# Patient Record
Sex: Female | Born: 1940 | Race: White | Hispanic: No | Marital: Single | Smoking: Never smoker
Health system: Southern US, Community
[De-identification: ages and names within clinical notes are randomized; demographics above are authoritative.]

## PROBLEM LIST (undated history)

## (undated) DIAGNOSIS — E785 Hyperlipidemia, unspecified: Secondary | ICD-10-CM

## (undated) DIAGNOSIS — S72009A Fracture of unspecified part of neck of unspecified femur, initial encounter for closed fracture: Secondary | ICD-10-CM

## (undated) DIAGNOSIS — M1610 Unilateral primary osteoarthritis, unspecified hip: Secondary | ICD-10-CM

## (undated) DIAGNOSIS — K219 Gastro-esophageal reflux disease without esophagitis: Secondary | ICD-10-CM

## (undated) DIAGNOSIS — M8580 Other specified disorders of bone density and structure, unspecified site: Secondary | ICD-10-CM

## (undated) DIAGNOSIS — K449 Diaphragmatic hernia without obstruction or gangrene: Secondary | ICD-10-CM

## (undated) DIAGNOSIS — M1990 Unspecified osteoarthritis, unspecified site: Secondary | ICD-10-CM

## (undated) DIAGNOSIS — R413 Other amnesia: Secondary | ICD-10-CM

## (undated) DIAGNOSIS — R251 Tremor, unspecified: Secondary | ICD-10-CM

## (undated) DIAGNOSIS — I10 Essential (primary) hypertension: Secondary | ICD-10-CM

## (undated) HISTORY — DX: Hyperlipidemia, unspecified: E78.5

## (undated) HISTORY — PX: SMALL INTESTINE SURGERY: SHX150

## (undated) HISTORY — DX: Other amnesia: R41.3

## (undated) HISTORY — DX: Essential (primary) hypertension: I10

## (undated) HISTORY — DX: Unilateral primary osteoarthritis, unspecified hip: M16.10

## (undated) HISTORY — DX: Gastro-esophageal reflux disease without esophagitis: K21.9

## (undated) HISTORY — DX: Other specified disorders of bone density and structure, unspecified site: M85.80

## (undated) HISTORY — DX: Fracture of unspecified part of neck of unspecified femur, initial encounter for closed fracture: S72.009A

## (undated) HISTORY — DX: Diaphragmatic hernia without obstruction or gangrene: K44.9

## (undated) HISTORY — DX: Tremor, unspecified: R25.1

## (undated) HISTORY — PX: ABDOMINAL HYSTERECTOMY: SHX81

## (undated) HISTORY — DX: Unspecified osteoarthritis, unspecified site: M19.90

---

## 2001-06-29 ENCOUNTER — Ambulatory Visit (HOSPITAL_COMMUNITY): Admission: RE | Admit: 2001-06-29 | Discharge: 2001-06-29 | Payer: Self-pay | Admitting: Gastroenterology

## 2001-07-09 ENCOUNTER — Encounter: Payer: Self-pay | Admitting: Gastroenterology

## 2001-07-09 ENCOUNTER — Ambulatory Visit (HOSPITAL_COMMUNITY): Admission: RE | Admit: 2001-07-09 | Discharge: 2001-07-09 | Payer: Self-pay | Admitting: Gastroenterology

## 2001-07-09 ENCOUNTER — Encounter (INDEPENDENT_AMBULATORY_CARE_PROVIDER_SITE_OTHER): Payer: Self-pay | Admitting: Specialist

## 2001-07-19 ENCOUNTER — Encounter: Payer: Self-pay | Admitting: Gastroenterology

## 2001-07-19 ENCOUNTER — Ambulatory Visit (HOSPITAL_COMMUNITY): Admission: RE | Admit: 2001-07-19 | Discharge: 2001-07-19 | Payer: Self-pay | Admitting: Gastroenterology

## 2009-03-08 ENCOUNTER — Emergency Department (HOSPITAL_COMMUNITY): Admission: EM | Admit: 2009-03-08 | Discharge: 2009-03-08 | Payer: Self-pay | Admitting: Emergency Medicine

## 2009-08-22 ENCOUNTER — Inpatient Hospital Stay (HOSPITAL_COMMUNITY): Admission: EM | Admit: 2009-08-22 | Discharge: 2009-08-31 | Payer: Self-pay | Admitting: Emergency Medicine

## 2010-07-16 LAB — GLUCOSE, CAPILLARY
Glucose-Capillary: 101 mg/dL — ABNORMAL HIGH (ref 70–99)
Glucose-Capillary: 104 mg/dL — ABNORMAL HIGH (ref 70–99)
Glucose-Capillary: 105 mg/dL — ABNORMAL HIGH (ref 70–99)
Glucose-Capillary: 110 mg/dL — ABNORMAL HIGH (ref 70–99)
Glucose-Capillary: 110 mg/dL — ABNORMAL HIGH (ref 70–99)
Glucose-Capillary: 112 mg/dL — ABNORMAL HIGH (ref 70–99)
Glucose-Capillary: 123 mg/dL — ABNORMAL HIGH (ref 70–99)
Glucose-Capillary: 124 mg/dL — ABNORMAL HIGH (ref 70–99)
Glucose-Capillary: 94 mg/dL (ref 70–99)
Glucose-Capillary: 95 mg/dL (ref 70–99)
Glucose-Capillary: 98 mg/dL (ref 70–99)
Glucose-Capillary: 98 mg/dL (ref 70–99)
Glucose-Capillary: 99 mg/dL (ref 70–99)
Glucose-Capillary: 109 mg/dL — ABNORMAL HIGH (ref 70–99)
Glucose-Capillary: 111 mg/dL — ABNORMAL HIGH (ref 70–99)
Glucose-Capillary: 112 mg/dL — ABNORMAL HIGH (ref 70–99)
Glucose-Capillary: 113 mg/dL — ABNORMAL HIGH (ref 70–99)
Glucose-Capillary: 122 mg/dL — ABNORMAL HIGH (ref 70–99)
Glucose-Capillary: 131 mg/dL — ABNORMAL HIGH (ref 70–99)
Glucose-Capillary: 133 mg/dL — ABNORMAL HIGH (ref 70–99)
Glucose-Capillary: 134 mg/dL — ABNORMAL HIGH (ref 70–99)
Glucose-Capillary: 136 mg/dL — ABNORMAL HIGH (ref 70–99)
Glucose-Capillary: 141 mg/dL — ABNORMAL HIGH (ref 70–99)
Glucose-Capillary: 145 mg/dL — ABNORMAL HIGH (ref 70–99)
Glucose-Capillary: 146 mg/dL — ABNORMAL HIGH (ref 70–99)
Glucose-Capillary: 153 mg/dL — ABNORMAL HIGH (ref 70–99)
Glucose-Capillary: 176 mg/dL — ABNORMAL HIGH (ref 70–99)
Glucose-Capillary: 99 mg/dL (ref 70–99)

## 2010-07-16 LAB — BASIC METABOLIC PANEL
BUN: 11 mg/dL (ref 6–23)
BUN: 11 mg/dL (ref 6–23)
CO2: 23 mEq/L (ref 19–32)
CO2: 23 mEq/L (ref 19–32)
CO2: 26 mEq/L (ref 19–32)
Calcium: 7.5 mg/dL — ABNORMAL LOW (ref 8.4–10.5)
Calcium: 7.8 mg/dL — ABNORMAL LOW (ref 8.4–10.5)
Chloride: 109 mEq/L (ref 96–112)
Chloride: 109 mEq/L (ref 96–112)
Creatinine, Ser: 0.62 mg/dL (ref 0.4–1.2)
Creatinine, Ser: 0.7 mg/dL (ref 0.4–1.2)
Creatinine, Ser: 0.7 mg/dL (ref 0.4–1.2)
Creatinine, Ser: 0.73 mg/dL (ref 0.4–1.2)
GFR calc Af Amer: >60 mL/min (ref >60)
GFR calc Af Amer: >60 mL/min (ref >60)
GFR calc non Af Amer: >60 mL/min (ref >60)
Glucose, Bld: 99 mg/dL (ref 70–99)

## 2010-07-16 LAB — DIFFERENTIAL
Eosinophils Relative: 1 % (ref 0–5)
Lymphocytes Relative: 18 % (ref 12–46)
Lymphs Abs: 1.4 10*3/uL (ref 0.7–4.0)
Neutro Abs: 6.2 10*3/uL (ref 1.7–7.7)

## 2010-07-16 LAB — URINALYSIS, ROUTINE W REFLEX MICROSCOPIC
Bilirubin Urine: NEGATIVE
Nitrite: NEGATIVE
Specific Gravity, Urine: 1.016 (ref 1.005–1.030)
Urobilinogen, UA: 0.2 mg/dL (ref 0.0–1.0)
pH: 7.5 (ref 5.0–8.0)

## 2010-07-16 LAB — COMPREHENSIVE METABOLIC PANEL
AST: 24 U/L (ref 0–37)
Albumin: 4.3 g/dL (ref 3.5–5.2)
BUN: 14 mg/dL (ref 6–23)
CO2: 30 mEq/L (ref 19–32)
Calcium: 9.7 mg/dL (ref 8.4–10.5)
Creatinine, Ser: 0.8 mg/dL (ref 0.4–1.2)
GFR calc Af Amer: >60 mL/min (ref >60)
GFR calc non Af Amer: >60 mL/min (ref >60)

## 2010-07-16 LAB — PHOSPHORUS: Phosphorus: 2.3 mg/dL (ref 2.3–4.6)

## 2010-07-16 LAB — CBC
MCHC: 34 g/dL (ref 30.0–36.0)
MCHC: 34.7 g/dL (ref 30.0–36.0)
MCV: 86.5 fL (ref 78.0–100.0)
Platelets: 134 10*3/uL — ABNORMAL LOW (ref 150–400)
Platelets: 161 10*3/uL (ref 150–400)
RBC: 3.74 MIL/uL — ABNORMAL LOW (ref 3.87–5.11)
RDW: 13.4 % (ref 11.5–15.5)
WBC: 7 10*3/uL (ref 4.0–10.5)

## 2010-07-16 LAB — TYPE AND SCREEN: ABO/RH(D): A POS

## 2010-07-16 LAB — MAGNESIUM: Magnesium: 2.1 mg/dL (ref 1.5–2.5)

## 2010-07-16 LAB — LIPASE, BLOOD: Lipase: 33 U/L (ref 11–59)

## 2011-04-29 DIAGNOSIS — I4510 Unspecified right bundle-branch block: Secondary | ICD-10-CM

## 2011-04-29 HISTORY — DX: Unspecified right bundle-branch block: I45.10

## 2012-04-08 ENCOUNTER — Other Ambulatory Visit: Payer: Self-pay | Admitting: Ophthalmology

## 2014-06-30 ENCOUNTER — Other Ambulatory Visit (HOSPITAL_COMMUNITY): Payer: Self-pay | Admitting: *Deleted

## 2014-07-03 ENCOUNTER — Encounter (HOSPITAL_COMMUNITY)
Admission: RE | Admit: 2014-07-03 | Discharge: 2014-07-03 | Disposition: A | Discharge status: Home / Self Care | Payer: Medicare Other | Source: Ambulatory Visit | Attending: Internal Medicine | Admitting: Internal Medicine

## 2014-07-03 DIAGNOSIS — M810 Age-related osteoporosis without current pathological fracture: Secondary | ICD-10-CM | POA: Insufficient documentation

## 2014-07-03 DIAGNOSIS — Z5181 Encounter for therapeutic drug level monitoring: Secondary | ICD-10-CM | POA: Diagnosis not present

## 2014-07-03 MED ORDER — DENOSUMAB 60 MG/ML ~~LOC~~ SOLN
60.0000 mg | Freq: Once | SUBCUTANEOUS | Status: AC
  Administered 2014-07-03: 60 mg via SUBCUTANEOUS
  Filled 2014-07-03: qty 1

## 2014-12-13 ENCOUNTER — Other Ambulatory Visit (HOSPITAL_COMMUNITY): Payer: Self-pay | Admitting: *Deleted

## 2014-12-14 ENCOUNTER — Ambulatory Visit (HOSPITAL_COMMUNITY)
Admission: RE | Admit: 2014-12-14 | Discharge: 2014-12-14 | Disposition: A | Discharge status: Home / Self Care | Payer: Medicare Other | Source: Ambulatory Visit | Attending: Internal Medicine | Admitting: Internal Medicine

## 2014-12-14 DIAGNOSIS — M810 Age-related osteoporosis without current pathological fracture: Secondary | ICD-10-CM | POA: Insufficient documentation

## 2014-12-14 MED ORDER — DENOSUMAB 60 MG/ML ~~LOC~~ SOLN
60.0000 mg | Freq: Once | SUBCUTANEOUS | Status: AC
  Administered 2014-12-14: 60 mg via SUBCUTANEOUS
  Filled 2014-12-14: qty 1

## 2015-06-28 ENCOUNTER — Other Ambulatory Visit (HOSPITAL_COMMUNITY): Payer: Self-pay | Admitting: *Deleted

## 2015-06-29 ENCOUNTER — Encounter (HOSPITAL_COMMUNITY)
Admission: RE | Admit: 2015-06-29 | Discharge: 2015-06-29 | Disposition: A | Discharge status: Home / Self Care | Payer: Medicare Other | Source: Ambulatory Visit | Attending: Internal Medicine | Admitting: Internal Medicine

## 2015-06-29 DIAGNOSIS — M810 Age-related osteoporosis without current pathological fracture: Secondary | ICD-10-CM | POA: Insufficient documentation

## 2015-06-29 MED ORDER — DENOSUMAB 60 MG/ML ~~LOC~~ SOLN
60.0000 mg | Freq: Once | SUBCUTANEOUS | Status: AC
  Administered 2015-06-29: 60 mg via SUBCUTANEOUS
  Filled 2015-06-29: qty 1

## 2016-02-20 ENCOUNTER — Other Ambulatory Visit (HOSPITAL_COMMUNITY): Payer: Self-pay | Admitting: *Deleted

## 2016-02-21 ENCOUNTER — Ambulatory Visit (HOSPITAL_COMMUNITY)
Admission: RE | Admit: 2016-02-21 | Discharge: 2016-02-21 | Disposition: A | Discharge status: Home / Self Care | Payer: Medicare Other | Source: Ambulatory Visit | Attending: Internal Medicine | Admitting: Internal Medicine

## 2016-02-21 DIAGNOSIS — M810 Age-related osteoporosis without current pathological fracture: Secondary | ICD-10-CM | POA: Insufficient documentation

## 2016-02-21 MED ORDER — DENOSUMAB 60 MG/ML ~~LOC~~ SOLN
60.0000 mg | Freq: Once | SUBCUTANEOUS | Status: AC
  Administered 2016-02-21: 60 mg via SUBCUTANEOUS
  Filled 2016-02-21: qty 1

## 2016-08-22 ENCOUNTER — Other Ambulatory Visit (HOSPITAL_COMMUNITY): Payer: Self-pay | Admitting: *Deleted

## 2016-08-25 ENCOUNTER — Encounter (HOSPITAL_COMMUNITY): Payer: Medicare Other

## 2016-09-01 ENCOUNTER — Other Ambulatory Visit (HOSPITAL_COMMUNITY): Payer: Self-pay | Admitting: *Deleted

## 2016-09-02 ENCOUNTER — Inpatient Hospital Stay (HOSPITAL_COMMUNITY): Admission: RE | Admit: 2016-09-02 | Payer: Medicare Other | Source: Ambulatory Visit

## 2016-09-10 ENCOUNTER — Ambulatory Visit (HOSPITAL_COMMUNITY)
Admission: RE | Admit: 2016-09-10 | Discharge: 2016-09-10 | Disposition: A | Discharge status: Home / Self Care | Payer: Medicare Other | Source: Ambulatory Visit | Attending: Internal Medicine | Admitting: Internal Medicine

## 2016-09-10 DIAGNOSIS — M810 Age-related osteoporosis without current pathological fracture: Secondary | ICD-10-CM | POA: Diagnosis not present

## 2016-09-10 MED ORDER — DENOSUMAB 60 MG/ML ~~LOC~~ SOLN
60.0000 mg | Freq: Once | SUBCUTANEOUS | Status: AC
  Administered 2016-09-10: 60 mg via SUBCUTANEOUS
  Filled 2016-09-10: qty 1

## 2017-01-15 ENCOUNTER — Other Ambulatory Visit (HOSPITAL_COMMUNITY): Payer: Self-pay | Admitting: *Deleted

## 2017-01-16 ENCOUNTER — Ambulatory Visit (HOSPITAL_COMMUNITY)
Admission: RE | Admit: 2017-01-16 | Discharge: 2017-01-16 | Disposition: A | Discharge status: Home / Self Care | Payer: Medicare Other | Source: Ambulatory Visit | Attending: Internal Medicine | Admitting: Internal Medicine

## 2017-01-16 DIAGNOSIS — M810 Age-related osteoporosis without current pathological fracture: Secondary | ICD-10-CM | POA: Diagnosis present

## 2017-01-16 MED ORDER — DENOSUMAB 60 MG/ML ~~LOC~~ SOLN
60.0000 mg | Freq: Once | SUBCUTANEOUS | Status: AC
  Administered 2017-01-16: 60 mg via SUBCUTANEOUS
  Filled 2017-01-16: qty 1

## 2017-09-08 ENCOUNTER — Other Ambulatory Visit (HOSPITAL_COMMUNITY): Payer: Self-pay | Admitting: *Deleted

## 2017-09-09 ENCOUNTER — Ambulatory Visit (HOSPITAL_COMMUNITY)
Admission: RE | Admit: 2017-09-09 | Discharge: 2017-09-09 | Disposition: A | Discharge status: Home / Self Care | Payer: Medicare Other | Source: Ambulatory Visit | Attending: Internal Medicine | Admitting: Internal Medicine

## 2017-09-09 DIAGNOSIS — M810 Age-related osteoporosis without current pathological fracture: Secondary | ICD-10-CM | POA: Insufficient documentation

## 2017-09-09 MED ORDER — DENOSUMAB 60 MG/ML ~~LOC~~ SOSY
60.0000 mg | PREFILLED_SYRINGE | Freq: Once | SUBCUTANEOUS | Status: AC
  Administered 2017-09-09: 60 mg via SUBCUTANEOUS
  Filled 2017-09-09: qty 1

## 2018-08-05 ENCOUNTER — Other Ambulatory Visit: Payer: Self-pay

## 2018-08-05 ENCOUNTER — Ambulatory Visit: Payer: Medicare Other | Admitting: Podiatry

## 2018-08-05 DIAGNOSIS — L601 Onycholysis: Secondary | ICD-10-CM

## 2018-08-05 DIAGNOSIS — B351 Tinea unguium: Secondary | ICD-10-CM | POA: Diagnosis not present

## 2018-08-05 NOTE — Patient Instructions (Signed)

## 2018-08-05 NOTE — Progress Notes (Signed)
Subjective:  Patient ID: Tracey Keith, female    DOB: [birth year],  MRN: [id number]  Chief Complaint  Patient presents with  . Ingrown Toenail    Reports pain to the R 2nd toenail for 3 weeks duration. Pain worst with hoes, sharp in nature, unable to wear regular shoes due to the pain.    78 y.o. female presents with the above complaint.   Review of Systems: Negative except as noted in the HPI. Denies N/V/F/Ch.  No past medical history on file.  Current Outpatient Medications:  .  diphenhydrAMINE (BENADRYL ALLERGY) 25 MG tablet, Take by mouth., Disp: , Rfl:  .  mometasone (ELOCON) 0.1 % ointment, Apply sparingly once or twice daily, Disp: , Rfl:  .  acetaminophen (TYLENOL) 500 MG tablet, Take by mouth., Disp: , Rfl:  .  aspirin 81 MG chewable tablet, Chew by mouth., Disp: , Rfl:  .  atorvastatin (LIPITOR) 80 MG tablet, Take by mouth., Disp: , Rfl:  .  BIOTIN 5000 PO, Take by mouth., Disp: , Rfl:  .  CALCIUM-VITAMIN D PO, Take by mouth., Disp: , Rfl:  .  fluticasone (FLONASE) 50 MCG/ACT nasal spray, fluticasone propionate 50 mcg/actuation nasal spray,suspension  USE 2 SPRAYS EACH NOSTRIL DAILY FOR 7 DAYS, THEN 1 SPRAY DAILY FOR 7 DAYS, Disp: , Rfl:  .  hydrochlorothiazide (HYDRODIURIL) 25 MG tablet, , Disp: , Rfl:  .  LUMIGAN 0.01 % SOLN, Place 1 drop into both eyes at bedtime., Disp: , Rfl:  .  meloxicam (MOBIC) 7.5 MG tablet, , Disp: , Rfl:  .  METFORMIN HCL PO, Take by mouth., Disp: , Rfl:  .  Multiple Vitamin (THERA) TABS, Take by mouth., Disp: , Rfl:  .  Omega-3 Fatty Acids (FISH OIL PO), Take by mouth., Disp: , Rfl:  .  omeprazole (PRILOSEC) 40 MG capsule, Take by mouth., Disp: , Rfl:  .  ONE TOUCH ULTRA TEST test strip, USE TO TEST BLOOD SUGARS ONCE DAILY, Disp: , Rfl:  .  sulfamethoxazole-trimethoprim (BACTRIM DS,SEPTRA DS) 800-160 MG tablet, sulfamethoxazole 800 mg-trimethoprim 160 mg tablet  1 TAB TWICE DAILY FOR 7 DAYS, Disp: , Rfl:  .  timolol (TIMOPTIC) 0.5 % ophthalmic  solution, , Disp: , Rfl:   Social History   Tobacco Use  Smoking Status Not on file    Allergies  Allergen Reactions  . Cephalexin Itching  . Codeine Nausea Only   Objective:  There were no vitals filed for this visit. There is no height or weight on file to calculate BMI. Constitutional Well developed. Well nourished.  Vascular Dorsalis pedis pulses palpable bilaterally. Posterior tibial pulses palpable bilaterally. Capillary refill normal to all digits.  No cyanosis or clubbing noted. Pedal hair growth normal.  Neurologic Normal speech. Oriented to person, place, and time. Epicritic sensation to light touch grossly present bilaterally.  Dermatologic Nails right 2nd toenail with onyhcolysis/onyhcomadesis No open wounds. No skin lesions.  Orthopedic: Normal joint ROM without pain or crepitus bilaterally. No visible deformities. No bony tenderness.   Radiographs: None Assessment:   1. Onychomycosis   2. Onycholysis    Plan:  Patient was evaluated and treated and all questions answered.  R 2nd Toenail Lysis -Nail removed as below. -Educated on post-op care  Procedure: Avulsion of toenail Location: Right 2nd toenail  Anesthesia: Lidocaine 1% plain; 1.5 mL and Marcaine 0.5% plain; 1.5 mL, digital block. Skin Prep: Betadine. Dressing: Silvadene; telfa; dry, sterile, compression dressing. Technique: Following skin prep, the toe was exsanguinated and a  tourniquet was secured at the base of the toe. The nail was freed and avulsed with a hemostat. The area was cleansed. The tourniquet was then removed and sterile dressing applied. Disposition: Patient tolerated procedure well.     Return in about 2 weeks (around 08/19/2018) for Nail Check.

## 2019-05-04 ENCOUNTER — Ambulatory Visit: Payer: Medicare Other | Admitting: Diagnostic Neuroimaging

## 2019-06-23 ENCOUNTER — Encounter: Payer: Self-pay | Admitting: Neurology

## 2019-06-23 ENCOUNTER — Ambulatory Visit: Payer: Medicare Other | Admitting: Neurology

## 2019-06-23 ENCOUNTER — Telehealth: Payer: Self-pay | Admitting: Neurology

## 2019-06-23 ENCOUNTER — Other Ambulatory Visit: Payer: Self-pay

## 2019-06-23 VITALS — BP 160/75 | HR 75 | Temp 96.9°F | Ht 67.0 in | Wt 114.0 lb

## 2019-06-23 DIAGNOSIS — F0280 Dementia in other diseases classified elsewhere without behavioral disturbance: Secondary | ICD-10-CM

## 2019-06-23 DIAGNOSIS — G301 Alzheimer's disease with late onset: Secondary | ICD-10-CM | POA: Diagnosis not present

## 2019-06-23 DIAGNOSIS — R413 Other amnesia: Secondary | ICD-10-CM

## 2019-06-23 DIAGNOSIS — R269 Unspecified abnormalities of gait and mobility: Secondary | ICD-10-CM | POA: Diagnosis not present

## 2019-06-23 DIAGNOSIS — G250 Essential tremor: Secondary | ICD-10-CM

## 2019-06-23 MED ORDER — DONEPEZIL HCL 5 MG PO TABS: 5.0000 mg | ORAL_TABLET | Freq: Every day | ORAL | 5 refills | Status: DC

## 2019-06-23 NOTE — Telephone Encounter (Signed)
[employer] medicare order sent to [facility]. No auth they will reach out to the patient to schedule.  

## 2019-06-23 NOTE — Progress Notes (Signed)
[facility]  PATIENT: Tracey Keith DOB: 02-10-1941  REFERRING DOCTOR OR PCP: Guerry Bruin MD SOURCE: Patient, notes from primary care, lab reports.  _________________________________   HISTORICAL  CHIEF COMPLAINT:  Chief Complaint  Patient presents with  . Memory Loss    rm 12, New Pt,  Pat - niece  HC-POA    . Tremors    HISTORY OF PRESENT ILLNESS:  I had the pleasure of seeing your patient, Tracey Keith, at [facility] for neurologic consultation regarding her memory loss and tremors  She is an 79 year old woman who was noted by her family to have progressive memory loss over the past 2 years.   Her niece was first concerned 14 months ago when she got lost driving to a relative's house.    She also reports that her aunt has gotten lost while driving.    She does not always remember to use the GPS.   She was 'missing' for a few days when no one was able to get in touch with her.   She has forgotten to take medications and has not been regularly checking blood sugar.     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  She is driving daily including [employer] (back and forth between home and work and to the stores) and denies any difficulties.    She reports she does not have to balance any books.  She denies that she has had any problems doing paperwork for her job.    She has had some tremors I her hands.  She has had a few falls.   She does not remember any details of the falls  Montreal Cognitive Assessment  06/23/2019  Visuospatial/ Executive (0/5) 1  Naming (0/3) 3  Attention: Read list of digits (0/2) 1  Attention: Read list of letters (0/1) 1  Attention: Serial 7 subtraction starting at 100 (0/3) 0  Language: Repeat phrase (0/2) 0  Language : Fluency (0/1) 0  Abstraction (0/2) 1  Delayed Recall (0/5) 0  Orientation (0/6) 2  Total 9     REVIEW OF  SYSTEMS: Constitutional: No fevers, chills, sweats, or change in appetite Eyes: No visual changes, double vision, eye pain Ear, nose and throat: No hearing loss, ear pain, nasal congestion, sore throat Cardiovascular: No chest pain, palpitations Respiratory: No shortness of breath at rest or with exertion.   No wheezes GastrointestinaI: No nausea, vomiting, diarrhea, abdominal pain, fecal incontinence Genitourinary: No dysuria, urinary retention or frequency.  No nocturia. Musculoskeletal: No neck pain, back pain Integumentary: No rash, pruritus, skin lesions Neurological: as above Psychiatric: No depression at this time.  No anxiety Endocrine: No palpitations, diaphoresis, change in appetite, change in weigh or increased thirst Hematologic/Lymphatic: No anemia, purpura, petechiae. Allergic/Immunologic: No itchy/runny eyes, nasal congestion, recent allergic reactions, rashes  ALLERGIES: Allergies  Allergen Reactions  . Cephalexin Itching  . Codeine Nausea Only    HOME MEDICATIONS:  Current Outpatient Medications:  .  acetaminophen (TYLENOL) 500 MG tablet, Take by mouth., Disp: , Rfl:  .  aspirin 81 MG chewable tablet, Chew by mouth., Disp: , Rfl:  .  atorvastatin (LIPITOR) 80 MG tablet, Take by mouth., Disp: , Rfl:  .  CALCIUM-VITAMIN D PO, Take by mouth., Disp: , Rfl:  .  hydrochlorothiazide (HYDRODIURIL) 25 MG tablet, , Disp: , Rfl:  .  LUMIGAN 0.01 % SOLN, Place 1 drop into  both eyes at bedtime., Disp: , Rfl:  .  METFORMIN HCL PO, Take by mouth., Disp: , Rfl:  .  Multiple Vitamin (THERA) TABS, Take by mouth., Disp: , Rfl:  .  Omega-3 Fatty Acids (FISH OIL PO), Take by mouth., Disp: , Rfl:  .  omeprazole (PRILOSEC) 40 MG capsule, Take by mouth., Disp: , Rfl:  .  BIOTIN 5000 PO, Take by mouth., Disp: , Rfl:  .  meloxicam (MOBIC) 7.5 MG tablet, , Disp: , Rfl:  .  mometasone (ELOCON) 0.1 % ointment, Apply sparingly once or twice daily, Disp: , Rfl:  .  ONE TOUCH ULTRA TEST  test strip, USE TO TEST BLOOD SUGARS ONCE DAILY, Disp: , Rfl:  .  timolol (TIMOPTIC) 0.5 % ophthalmic solution, , Disp: , Rfl:   PAST MEDICAL HISTORY: Past Medical History:  Diagnosis Date  . 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 GERD (gastroesophageal reflux disease)    w/cough  . Hiatal hernia   . Hip fracture ([place]) 1980's   left  . Hyperlipidemia   . Hypertension   . Memory loss   . Osteoarthritis    hands, finger  . Osteopenia   . RBBB 2013  . Tremor     PAST SURGICAL HISTORY:   FAMILY HISTORY: Family History  Problem Relation Age of Onset  . Tremor Maternal Aunt     SOCIAL HISTORY:  Social History   Socioeconomic History  . Marital status: Single    Spouse name: Not on file  . Number of children: 1  . Years of education: Not on file  . Highest education level: High school graduate  Occupational History    Comment: retired, [place]: 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  Tobacco Use  . Smoking status: Never Smoker  . Smokeless tobacco: Never Used  Substance and Sexual Activity  . Alcohol use: Never  . Drug use: Never  . Sexual activity: Not on file  Other Topics Concern  . 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 Difficulty of Paying Living Expenses: Not on file  Food Insecurity:   . Worried About Charity fundraiser in the Last Year: Not on file  . Ran Out of Food in the Last Year: Not on file  Transportation Needs:   . Lack of Transportation (Medical): Not on file  . Lack of Transportation (Non-Medical): Not on file  Physical Activity:   . Days of Exercise per Week: Not on file  . Minutes of Exercise per Session: Not on file  Stress:   . Feeling of Stress : Not on file  Social Connections:   . Frequency of Communication with Friends and Family: Not on  file  . Frequency of Social Gatherings with Friends and Family: Not on file  . Attends Religious Services: Not on file  . Active Member of Clubs or Organizations: Not on file  . Attends Archivist Meetings: Not on file  . Marital Status: Not on file  Intimate Partner Violence:   . Fear of Current or Ex-Partner: Not on file  . Emotionally Abused: Not on file  . Physically Abused: Not on file  . Sexually Abused: Not on file     PHYSICAL EXAM  Vitals:   06/23/19 1006  BP: (!) 160/75  Pulse: 75  Temp: (!) 96.9 F (36.1 C)  Weight: 114 lb (51.7 kg)  Height: 5\' 7"  (1.702 m)    Body mass index is 17.85 kg/m.   General: The patient is well-developed and well-nourished and in no acute distress  HEENT:  Head is [place]/AT.  Sclera are anicteric.  Funduscopic exam shows normal optic discs and retinal vessels.  Neck: No carotid bruits are noted.  The neck is nontender.  Cardiovascular: The heart has a regular rate and rhythm with a normal S1 and S2. There were no murmurs, gallops or rubs.    Skin: Extremities are without rash or  edema.  Musculoskeletal:  Back is nontender  Neurologic Exam  Mental status: The patient is alert and oriented x1-1/2.  Short-term memory is 0/5 and calculations are poor.  Attention is poor.   Speech is normal.  Cranial nerves: Extraocular movements are full. Pupils are equal, round, and reactive to light and accomodation.  Visual fields are full.  Facial symmetry is present. There is good facial sensation to soft touch bilaterally.Facial strength is normal.  Trapezius and sternocleidomastoid strength is normal. No dysarthria is noted.  The tongue is midline, and the patient has symmetric elevation of the soft palate. No obvious hearing deficits are noted.  Motor:  Muscle bulk is normal.   Tone is normal. Strength is  5 / 5 in all 4 extremities.   Sensory: Sensory testing is intact to pinprick, soft touch and vibration sensation in all 4  extremities.  Coordination: Cerebellar testing reveals good finger-nose-finger and heel-to-shin bilaterally.  Gait and station: Station is normal.   Gait is slightly wide and arthritic. Tandem gait is mildly wide but probably normal for age. Romberg is negative.   Reflexes: Deep tendon reflexes are symmetric and normal bilaterally.   Plantar responses are flexor.      ASSESSMENT AND PLAN  Late onset Alzheimer's disease without behavioral disturbance (HCC) - Plan: MR BRAIN WO CONTRAST  Memory loss - Plan: MR BRAIN WO CONTRAST, Thyroid Panel With TSH, Vitamin B12  Gait disturbance - Plan: Thyroid Panel With TSH, Vitamin B12  Essential tremor - Plan: Thyroid Panel With TSH   In summary, Ms. Goto is an 79 year old woman with progressive memory decline over the past couple of years.  She scored only 9/30 on the [facility] cognitive assessment today which would place her in a moderate to severe dementia.  However, on the interaction she appeared to be more mild to moderate.  Most likely diagnosis is Alzheimer's disease.  I will have her start donepezil.  Additionally we will check an MRI of the brain to determine the extent and pattern of atrophy and also rule out alternative diagnoses.  I will also check a thyroid panel and vitamin B12.  Amazingly, she continues to work running a [employer].  I am concerned about her ability to drive as she has gotten lost several times.  I recommended to her and her niece that she stop driving.  She derives a lot of pressure from her job and I think she could continue to do that but I recommend that somebody else drive her to work.  She will return to see Diplomatic Services operational officer in 3 months or sooner for new or worsening neurologic symptoms.  The dose of donepezil can be increased.  Depending on her response, memantine could also be considered.  Thank you for asking me to see Ms. Hinchcliff.  Please let me know if I can be of further assistance with her or other patients in the  future.  Tracey Perritt A. Epimenio Foot, MD, PhD, [place] 06/23/2019, 10:42 AM Certified in Neurology, Clinical Neurophysiology, Sleep Medicine and Neuroimaging  [facility] [address] [place], [place] [address] [phone]

## 2019-06-24 LAB — THYROID PANEL WITH TSH
Free Thyroxine Index: 2 (ref 1.2–4.9)
T3 Uptake Ratio: 29 % (ref 24–39)
T4, Total: 7 ug/dL (ref 4.5–12.0)
TSH: 1.08 u[IU]/mL (ref 0.450–4.500)

## 2019-06-24 LAB — VITAMIN B12: Vitamin B-12: 392 pg/mL (ref 232–1245)

## 2019-06-27 ENCOUNTER — Telehealth: Payer: Self-pay | Admitting: *Deleted

## 2019-06-27 NOTE — Telephone Encounter (Signed)
I called and spoke with pt about results. She verbalized understanding. Due to memory issues, I called niece, Dennie [facility]) and relayed results to her as well. She is aware order for MRI sent to [facility]. They will be called to get scheduled. She verbalized understanding.

## 2019-06-27 NOTE — Telephone Encounter (Signed)
-----   Message from Asa Lente, MD sent at 06/24/2019 11:32 AM EST ----- Please let the patient know that the lab work is fine.   We will let her know the results of the brain MRi a few days after it is done.

## 2019-06-27 NOTE — Telephone Encounter (Signed)
-----   Message from Richard A Sater, MD sent at 06/24/2019 11:32 AM EST ----- Please let the patient know that the lab work is fine.   We will let her know the results of the brain MRi a few days after it is done.    

## 2019-07-22 ENCOUNTER — Other Ambulatory Visit: Payer: Self-pay

## 2019-07-22 ENCOUNTER — Ambulatory Visit
Admission: RE | Admit: 2019-07-22 | Discharge: 2019-07-22 | Disposition: A | Discharge status: Home / Self Care | Payer: Medicare Other | Source: Ambulatory Visit | Attending: Neurology | Admitting: Neurology

## 2019-07-22 DIAGNOSIS — F0280 Dementia in other diseases classified elsewhere without behavioral disturbance: Secondary | ICD-10-CM

## 2019-07-22 DIAGNOSIS — R413 Other amnesia: Secondary | ICD-10-CM | POA: Diagnosis not present

## 2019-07-22 DIAGNOSIS — G301 Alzheimer's disease with late onset: Secondary | ICD-10-CM | POA: Diagnosis not present

## 2019-07-25 ENCOUNTER — Telehealth: Payer: Self-pay | Admitting: *Deleted

## 2019-07-25 NOTE — Telephone Encounter (Signed)
-----   Message from Asa Lente, MD sent at 07/22/2019  6:02 PM EDT ----- Let her and her family know that the MRI of the brain showed atrophy in a pattern consistent with Alzheimer's disease.  If she is tolerating donepezil, the dose can be increased from 5 mg to 10 mg daily.

## 2019-07-25 NOTE — Telephone Encounter (Signed)
Called and spoke with Tracey Keith (on [facility]-POA). Relayed Dr. Bonnita Hollow message about results. She verbalized understanding. She is requesting to schedule another visit with Dr. Epimenio Foot. She has continued to drive and Tracey Keith has not been able to take away car keys because she was diagnosed with covid-19 same week she saw Dr. Epimenio Foot and had to quarantine. She heard from gentleman that rents room from her mother that she stopped doing laundry/cleaing. Parks the car crooked. She spoke with her this past weekend about no longer driving but pt wants to hear it directly from Dr. Epimenio Foot. She does not remember the first visit she had with him. Advised I will discuss with MD and call her back.

## 2019-07-25 NOTE — Telephone Encounter (Signed)
Called Pat back. Per Dr. Epimenio Foot, ok to work pt in. Scheduled appt for 07/27/19 at 2:30pm. Asked they check in by 2:00pm, wear mask. She verbalized understanding.

## 2019-07-27 ENCOUNTER — Ambulatory Visit: Payer: Medicare Other | Admitting: Neurology

## 2019-07-27 ENCOUNTER — Encounter: Payer: Self-pay | Admitting: Neurology

## 2019-07-27 ENCOUNTER — Other Ambulatory Visit: Payer: Self-pay

## 2019-07-27 VITALS — BP 183/72 | HR 78 | Temp 97.6°F | Ht 67.0 in | Wt 116.5 lb

## 2019-07-27 DIAGNOSIS — Z789 Other specified health status: Secondary | ICD-10-CM | POA: Insufficient documentation

## 2019-07-27 DIAGNOSIS — F0280 Dementia in other diseases classified elsewhere without behavioral disturbance: Secondary | ICD-10-CM | POA: Diagnosis not present

## 2019-07-27 DIAGNOSIS — G301 Alzheimer's disease with late onset: Secondary | ICD-10-CM | POA: Diagnosis not present

## 2019-07-27 MED ORDER — DONEPEZIL HCL 5 MG PO TABS: 5.0000 mg | ORAL_TABLET | Freq: Every day | ORAL | 5 refills | Status: DC

## 2019-07-27 NOTE — Progress Notes (Signed)
[facility]  PATIENT: Tracey Keith DOB: [birth year]  REFERRING DOCTOR OR PCP: Guerry Bruin MD SOURCE: Patient, notes from primary care, lab reports.  _________________________________   HISTORICAL  CHIEF COMPLAINT:  Chief Complaint  Patient presents with  . Follow-up    RM 12 with niece, Pat (temp: 98.1). Here to discuss worsening memory, driving.     HISTORY OF PRESENT ILLNESS:  [name] ia a 79 y.o. 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  Update 07/27/2019: Since the last visit, she was prescribed donepezil and has had an imaging study.  She has not been taking any recently.    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   She drives herself and has been lost a few times.   We again discussed that she should not drive.     MRI of the brain was personally reviewed.  It shows generalized cortical atrophy that is moderately severe in the medial temporal lobes and milder elsewhere.  The atrophy has occurred since a 2010 CT scan.  She also has some scattered T2/FLAIR hyperintense foci in the hemispheres consistent with minimal chronic microvascular ischemic change, typical for age.   From initial consultation 06/23/2019: She is an 79 year old woman who was noted by her family to have progressive memory loss over the past 2 years.   Her niece was first concerned 14 months ago when she got lost driving to a relative's house.    She also reports that her aunt has gotten lost while driving.    She does not always remember to use the GPS.   She was 'missing' for a few days when no one was able to get in touch with her.   She has forgotten to take medications and has not been regularly checking blood sugar.     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  She is driving daily including [address] (back and forth between home and work and to the stores)  and denies any difficulties.    She reports she does not have to balance any books.  She denies that she has had any problems doing paperwork for her job.    She has had some tremors I her hands.  She has had a few falls.   She does not remember any details of the falls  Montreal Cognitive Assessment  06/23/2019  Visuospatial/ Executive (0/5) 1  Naming (0/3) 3  Attention: Read list of digits (0/2) 1  Attention: Read list of letters (0/1) 1  Attention: Serial 7 subtraction starting at 100 (0/3) 0  Language: Repeat phrase (0/2) 0  Language : Fluency (0/1) 0  Abstraction (0/2) 1  Delayed Recall (0/5) 0  Orientation (0/6) 2  Total 9     REVIEW OF SYSTEMS: Constitutional: No fevers, chills, sweats, or change in appetite Eyes: No visual changes, double vision, eye pain Ear, nose and throat: No hearing loss, ear pain, nasal congestion, sore throat Cardiovascular: No chest pain, palpitations Respiratory: No shortness of breath at rest or with exertion.   No wheezes GastrointestinaI: No nausea, vomiting, diarrhea, abdominal pain, fecal incontinence Genitourinary: No dysuria, urinary retention or frequency.  No nocturia. Musculoskeletal: No neck pain, back pain Integumentary: No rash, pruritus, skin lesions Neurological: as above Psychiatric: No depression at this time.  No anxiety Endocrine: No palpitations, diaphoresis, change in appetite, change in weigh  or increased thirst Hematologic/Lymphatic: No anemia, purpura, petechiae. Allergic/Immunologic: No itchy/runny eyes, nasal congestion, recent allergic reactions, rashes  ALLERGIES: Allergies  Allergen Reactions  . Cephalexin Itching  . Codeine Nausea Only    HOME MEDICATIONS:  Current Outpatient Medications:  .  acetaminophen (TYLENOL) 500 MG tablet, Take by mouth., Disp: , Rfl:  .  aspirin 81 MG chewable tablet, Chew by mouth., Disp: , Rfl:  .  atorvastatin (LIPITOR) 80 MG tablet, Take by mouth., Disp: , Rfl:  .  BIOTIN  5000 PO, Take by mouth., Disp: , Rfl:  .  CALCIUM-VITAMIN D PO, Take by mouth., Disp: , Rfl:  .  donepezil (ARICEPT) 5 MG tablet, Take 1 tablet (5 mg total) by mouth at bedtime., Disp: 30 tablet, Rfl: 5 .  hydrochlorothiazide (HYDRODIURIL) 25 MG tablet, , Disp: , Rfl:  .  LUMIGAN 0.01 % SOLN, Place 1 drop into both eyes at bedtime., Disp: , Rfl:  .  meloxicam (MOBIC) 7.5 MG tablet, , Disp: , Rfl:  .  METFORMIN HCL PO, Take by mouth., Disp: , Rfl:  .  mometasone (ELOCON) 0.1 % ointment, Apply sparingly once or twice daily, Disp: , Rfl:  .  Multiple Vitamin (THERA) TABS, Take by mouth., Disp: , Rfl:  .  Omega-3 Fatty Acids (FISH OIL PO), Take by mouth., Disp: , Rfl:  .  omeprazole (PRILOSEC) 40 MG capsule, Take by mouth., Disp: , Rfl:  .  ONE TOUCH ULTRA TEST test strip, USE TO TEST BLOOD SUGARS ONCE DAILY, Disp: , Rfl:  .  timolol (TIMOPTIC) 0.5 % ophthalmic solution, , Disp: , Rfl:   PAST MEDICAL HISTORY: Past Medical History:  Diagnosis Date  . 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 GERD (gastroesophageal reflux disease)    w/cough  . Hiatal hernia   . Hip fracture ([place]) 1980's   left  . Hyperlipidemia   . Hypertension   . Memory loss   . Osteoarthritis    hands, finger  . Osteopenia   . RBBB 2013  . Tremor     PAST SURGICAL HISTORY:   FAMILY HISTORY: Family History  Problem Relation Age of Onset  . Tremor Maternal Aunt     SOCIAL HISTORY:  Social History   Socioeconomic History  . Marital status: Single    Spouse name: Not on file  . Number of children: 1  . Years of education: Not on file  . Highest education level: High school graduate  Occupational History    Comment: retired, [place]: 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  Tobacco Use  . Smoking status: Never Smoker  . Smokeless tobacco: Never Used  Substance and Sexual Activity  . Alcohol use: Never  . Drug use: Never  . Sexual activity: Not on file  Other Topics  Concern  . 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 Difficulty of Paying Living Expenses:   Food Insecurity:   . Worried About Charity fundraiser in the Last Year:   . Arboriculturist in the Last Year:   Transportation Needs:   . Film/video editor (Medical):   [employer] Lack of Transportation (Non-Medical):   Physical Activity:   . Days of Exercise per Week:   .  Minutes of Exercise per Session:   Stress:   . Feeling of Stress :   Social Connections:   . Frequency of Communication with Friends and Family:   . Frequency of Social Gatherings with Friends and Family:   . Attends Religious Services:   . Active Member of Clubs or Organizations:   . Attends Banker Meetings:   [employer] Marital Status:   Intimate Partner Violence:   . Fear of Current or Ex-Partner:   . Emotionally Abused:   Marland Kitchen Physically Abused:   . Sexually Abused:      PHYSICAL EXAM  Vitals:   07/27/19 1436  BP: (!) 183/72  Pulse: 78  Temp: 97.6 F (36.4 C)  Weight: 116 lb 8 oz (52.8 kg)  Height: 5\' 7"  (1.702 m)    Body mass index is 18.25 kg/m.   General: The patient is well-developed and well-nourished and in no acute distress  HEENT:  Head is [place]/AT.  Sclera are anicteric.   Skin: Extremities are without rash or  edema.   Neurologic Exam  Mental status: The patient is alert and oriented x1-1/2.  Short-term memory is 0/5 and calculations are poor.  Attention is poor.   Speech is normal.  Cranial nerves: Extraocular movements are full. .  Facial symmetry is present. There is good facial sensation to soft touch bilaterally.Facial strength is normal.  Trapezius and sternocleidomastoid strength is normal. No dysarthria is noted.    No obvious hearing deficits are noted.  Motor:  Muscle bulk is normal.   Tone is normal. Strength is  5 /  5 in all 4 extremities.   Sensory: Sensory testing is intact to pinprick, soft touch and vibration sensation in all 4 extremities.  Coordination: Cerebellar testing reveals good finger-nose-finger and heel-to-shin bilaterally.  Gait and station: Station is normal.  The gait is mildly arthritic and mildly wide.. Tandem gait is mildly wide but probably normal for age. Romberg is negative.   Reflexes: Deep tendon reflexes are symmetric and normal bilaterally.         ASSESSMENT AND PLAN  Late onset Alzheimer's disease without behavioral disturbance (HCC)  Impaired driving skills  1.  We had a very long discussion about her ability to continue to work and to drive.  She has Alzheimer's disease with considerable memory loss and severe hippocampal atrophy seen on the MRI.  She has gotten lost several times while driving.  I told her multiple times in multiple different ways that she should not drive any more.  I believe that she was excluding herself and others at danger.  Physically she is doing well and has a low stress job that she enjoys doing.  She could continue to work running the [employer]land [employer] shop at the hotel but she will need to find other arrangements for transportation.  She was upset that she needs to stop driving and we discussed different options such as using a Uber/Lyft, trying to find somebody to take her to work or to move to an assisted living.  Her niece who had accompanied her with both visit lives in [place] and is willing to help her look at assisted living options.. 2.   I sent in another prescription for donepezil as she had not picked up her previous one.  Dose can be increased to 10 mg if tolerated.  I would also consider adding memantine in the future. 3.    Return in 6 months or sooner for new or worsening neurologic symptoms.  40-minute office visit with the majority of the time spent face-to-face for history and physical, discussion/counseling and decision-making.   Additional time with record review and documentation.  Mileidy Atkin A. Epimenio Foot, MD, PhD, [place] 07/27/2019, 9:28 PM Certified in Neurology, Clinical Neurophysiology, Sleep Medicine and Neuroimaging  [facility] [address], [address] [place], [place] [address] [phone]

## 2019-08-02 ENCOUNTER — Telehealth: Payer: Self-pay | Admitting: Neurology

## 2019-08-02 NOTE — Telephone Encounter (Signed)
Pt's niece Melodye Ped on [place] called wanting to know if the provider can fill out a FL2 form for the pt to be able to be put in a home. Please advise.

## 2019-08-02 NOTE — Telephone Encounter (Signed)
Called Pat. Advised form ready for pick up. She will come by to get form. I placed up front for pick up.

## 2019-08-02 NOTE — Telephone Encounter (Signed)
Form filled out, waiting on MD signature 

## 2019-08-25 ENCOUNTER — Emergency Department (HOSPITAL_COMMUNITY)
Admission: EM | Admit: 2019-08-25 | Discharge: 2019-08-28 | Disposition: A | Discharge status: Other Acute Inpatient Hospital | Payer: Medicare Other | Attending: Emergency Medicine | Admitting: Emergency Medicine

## 2019-08-25 ENCOUNTER — Encounter (HOSPITAL_COMMUNITY): Payer: Self-pay | Admitting: Emergency Medicine

## 2019-08-25 ENCOUNTER — Other Ambulatory Visit: Payer: Self-pay

## 2019-08-25 DIAGNOSIS — I4510 Unspecified right bundle-branch block: Secondary | ICD-10-CM | POA: Insufficient documentation

## 2019-08-25 DIAGNOSIS — F22 Delusional disorders: Secondary | ICD-10-CM | POA: Diagnosis not present

## 2019-08-25 DIAGNOSIS — F0390 Unspecified dementia without behavioral disturbance: Secondary | ICD-10-CM | POA: Diagnosis present

## 2019-08-25 DIAGNOSIS — F0391 Unspecified dementia with behavioral disturbance: Secondary | ICD-10-CM

## 2019-08-25 DIAGNOSIS — R451 Restlessness and agitation: Secondary | ICD-10-CM | POA: Insufficient documentation

## 2019-08-25 DIAGNOSIS — Z79899 Other long term (current) drug therapy: Secondary | ICD-10-CM | POA: Diagnosis not present

## 2019-08-25 DIAGNOSIS — E785 Hyperlipidemia, unspecified: Secondary | ICD-10-CM | POA: Insufficient documentation

## 2019-08-25 DIAGNOSIS — F29 Unspecified psychosis not due to a substance or known physiological condition: Secondary | ICD-10-CM

## 2019-08-25 DIAGNOSIS — I10 Essential (primary) hypertension: Secondary | ICD-10-CM | POA: Diagnosis not present

## 2019-08-25 DIAGNOSIS — Z20822 Contact with and (suspected) exposure to covid-19: Secondary | ICD-10-CM | POA: Insufficient documentation

## 2019-08-25 DIAGNOSIS — R531 Weakness: Secondary | ICD-10-CM | POA: Diagnosis not present

## 2019-08-25 LAB — CBC
HCT: 34.4 % — ABNORMAL LOW (ref 36.0–46.0)
Hemoglobin: 10.6 g/dL — ABNORMAL LOW (ref 12.0–15.0)
MCH: 26.2 pg (ref 26.0–34.0)
MCHC: 30.8 g/dL (ref 30.0–36.0)
MCV: 84.9 fL (ref 80.0–100.0)
Platelets: 168 10*3/uL (ref 150–400)
RBC: 4.05 MIL/uL (ref 3.87–5.11)
RDW: 13.5 % (ref 11.5–15.5)
WBC: 5.4 10*3/uL (ref 4.0–10.5)
nRBC: 0 % (ref 0.0–0.2)

## 2019-08-25 LAB — BASIC METABOLIC PANEL
Anion gap: 9 (ref 5–15)
BUN: 23 mg/dL (ref 8–23)
CO2: 27 mmol/L (ref 22–32)
Calcium: 9.8 mg/dL (ref 8.9–10.3)
Chloride: 104 mmol/L (ref 98–111)
Creatinine, Ser: 1.3 mg/dL — ABNORMAL HIGH (ref 0.44–1.00)
GFR calc Af Amer: 45 mL/min — ABNORMAL LOW (ref >60)
GFR calc non Af Amer: 39 mL/min — ABNORMAL LOW (ref >60)
Glucose, Bld: 139 mg/dL — ABNORMAL HIGH (ref 70–99)
Potassium: 3.8 mmol/L (ref 3.5–5.1)
Sodium: 140 mmol/L (ref 135–145)

## 2019-08-25 LAB — URINALYSIS, ROUTINE W REFLEX MICROSCOPIC
Bilirubin Urine: NEGATIVE
Glucose, UA: NEGATIVE mg/dL
Hgb urine dipstick: NEGATIVE
Ketones, ur: NEGATIVE mg/dL
Leukocytes,Ua: NEGATIVE
Nitrite: NEGATIVE
Protein, ur: NEGATIVE mg/dL
Specific Gravity, Urine: 1.006 (ref 1.005–1.030)
pH: 7 (ref 5.0–8.0)

## 2019-08-25 LAB — CBG MONITORING, ED: Glucose-Capillary: 140 mg/dL — ABNORMAL HIGH (ref 70–99)

## 2019-08-25 MED ORDER — SODIUM CHLORIDE 0.9% FLUSH
3.0000 mL | Freq: Once | INTRAVENOUS | Status: AC
  Administered 2019-08-26: 04:00:00 3 mL via INTRAVENOUS

## 2019-08-25 NOTE — ED Triage Notes (Addendum)
C/o generalized weakness x 3-4 days.  Denies nausea, vomiting, or pain.  No neuro deficits noted.

## 2019-08-25 NOTE — ED Notes (Signed)
Pt to SORT nurse and states that someone is playing a trick on her.  States her bag is gone and that someone took it.  I informed pt that I didn't see a bag when I triaged her and she continues to say someone is tricking her.  States she was brought here for an appt but doesn't know how she got here and why she is here.  I informed her that she told me her niece brought her because she had generalized weakness.  She states no that she came from work and that we are tricking her.  I called emergency contact number (niece) that states she is outside.  I asked her to come in to sit with pt and told her it was approved due to pt's confusion.  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  Niece came in to sit with pt and she has her bag/pocketbook with her.

## 2019-08-26 ENCOUNTER — Emergency Department (HOSPITAL_COMMUNITY): Payer: Medicare Other

## 2019-08-26 LAB — RESPIRATORY PANEL BY RT PCR (FLU A&B, COVID)
Influenza A by PCR: NEGATIVE
Influenza B by PCR: NEGATIVE
SARS Coronavirus 2 by RT PCR: NEGATIVE

## 2019-08-26 LAB — ETHANOL: Alcohol, Ethyl (B): <10 mg/dL (ref <10)

## 2019-08-26 LAB — RAPID URINE DRUG SCREEN, HOSP PERFORMED
Amphetamines: NOT DETECTED
Barbiturates: NOT DETECTED
Benzodiazepines: NOT DETECTED
Cocaine: NOT DETECTED
Opiates: NOT DETECTED
Tetrahydrocannabinol: NOT DETECTED

## 2019-08-26 MED ORDER — DONEPEZIL HCL 5 MG PO TABS
5.0000 mg | ORAL_TABLET | Freq: Every day | ORAL | Status: DC
  Administered 2019-08-26 – 2019-08-27 (×2): 5 mg via ORAL
  Filled 2019-08-26 (×2): qty 1

## 2019-08-26 MED ORDER — ASPIRIN 81 MG PO CHEW
81.0000 mg | CHEWABLE_TABLET | Freq: Every day | ORAL | Status: DC
  Administered 2019-08-26 – 2019-08-28 (×3): 81 mg via ORAL
  Filled 2019-08-26 (×3): qty 1

## 2019-08-26 MED ORDER — LORAZEPAM 2 MG/ML IJ SOLN
1.0000 mg | Freq: Once | INTRAMUSCULAR | Status: AC
  Administered 2019-08-26: 18:00:00 1 mg via INTRAVENOUS
  Filled 2019-08-26: qty 1

## 2019-08-26 MED ORDER — LORAZEPAM 2 MG/ML IJ SOLN
1.0000 mg | Freq: Once | INTRAMUSCULAR | Status: AC
  Administered 2019-08-26: 03:00:00 1 mg via INTRAVENOUS
  Filled 2019-08-26: qty 1

## 2019-08-26 MED ORDER — HYDROCHLOROTHIAZIDE 25 MG PO TABS
25.0000 mg | ORAL_TABLET | Freq: Every day | ORAL | Status: DC
  Administered 2019-08-26 – 2019-08-28 (×3): 25 mg via ORAL
  Filled 2019-08-26 (×3): qty 1

## 2019-08-26 MED ORDER — SODIUM CHLORIDE 0.9 % IV BOLUS
1000.0000 mL | Freq: Once | INTRAVENOUS | Status: AC
  Administered 2019-08-26: 03:00:00 1000 mL via INTRAVENOUS

## 2019-08-26 MED ORDER — ATORVASTATIN CALCIUM 80 MG PO TABS
80.0000 mg | ORAL_TABLET | Freq: Every day | ORAL | Status: DC
  Administered 2019-08-26 – 2019-08-28 (×3): 80 mg via ORAL
  Filled 2019-08-26 (×3): qty 1

## 2019-08-26 NOTE — ED Notes (Signed)
Sitter at Bedside.

## 2019-08-26 NOTE — BH Assessment (Signed)
Tele Assessment Note   Patient Name: Tracey Keith MRN: [id number] Referring Physician: Farrel Gordon, PA Location of Patient: [facility] Location of Provider: Behavioral Health TTS Department  Tracey Keith is an 79 y.o. female.  -Clinician reviewed note by Farrel Gordon, PA.  Tracey Keith is a 79 y.o. female with pertinent past medical history of dementia, right bundle branch block, hypertension, osteopenia, hyperlipidemia that presents to emergency department with niece for concerns of patient's health. Patient is demented and paranoid patient is unable to provide history.  Entire history is provided by niece who is POA.  Patient is extremely paranoid on exam and thinks that her niece brought her in to play tricks on her and wants to go back to work.,  Her niece states that she was diagnosed with dementia about 5 months ago, however for the past 4 days she has been more altered.  She states that she has been driving recklessly and she has now taken away her keys, patient is extremely upset about this.  She also states that she has been noncompliant with her medications and becoming way more hostile to her. She states that patient has been making up stories recently and brought her into the emergency department today because she is worried she cannot take care of herself. She states that she has been working on getting a assisted living facility for patient, however it has been a process. Pt sees Dr. Iran Ouch for neurology, who did an MRI about 3 months ago which showed worsening dementia.  Pt was given Ativan at 02:43.  The PA initiated IVC process.  This clinician talked to patient's niece, Tracey Keith due to patient being sedated.    Niece said that patient had been agitated for awhile, especially after she had to have her car taken away from her.  Niece said that the patient had [place] [address] two weeks ago.  Dr. Iran Ouch (neurologist) had told patient that she had to stop driving.  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  Patient thinks that she will lose her house.  Patient is often upset and angry with niece over these developments and will argue w/ her and accuse her of things.  Yesterday (04/29) niece contacted social services to check on patient at her gift shop.  The social worker said that patient did not know her name right away   Pt has a person that rents a room from her and has done so for the last 10 years.  He has reported to niece that patient has been getting up at night and wandering the house.    Patient will think that people are trying to "trick her."  She is suspicious of others.  The staff at the hotel have noted that patient has been acting differently over the last few weeks.  Pt has no previous outpatient or inpatient psychiatric care.  She has a son who is in prison and this weighs on her mind a lot.  -Clinician did discuss patient care with Tracey Rakers, NP who recommended psychiatric evaluation when patient is alert.  She said that patient most likely will need geropsych placement.  Diagnosis: Dementia  Past Medical History:  Past Medical History:  Diagnosis Date  . 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 GERD (gastroesophageal reflux disease)    w/cough  . Hiatal hernia   .  Hip fracture (HCC) 1980's   left  . Hyperlipidemia   . Hypertension   . Memory loss   . Osteoarthritis    hands, finger  . Osteopenia   . RBBB 2013  . Tremor     Past Surgical History:  Procedure Laterality Date  . ABDOMINAL HYSTERECTOMY     many yrs ago  . SMALL INTESTINE SURGERY     for obstruction    Family History:  Family History  Problem Relation Age of Onset  . Tremor Maternal Aunt     Social History:  reports that she has never smoked. She has never used smokeless tobacco. She reports that she does not drink alcohol or use drugs.  Additional Social History:  Alcohol / Drug Use Pain  Medications: See PTA medication list Prescriptions: See PTA medication list Over the Counter: See PTA medication list History of alcohol / drug use?: No history of alcohol / drug abuse  CIWA: CIWA-Ar BP: 111/62 Pulse Rate: 71 COWS:    Allergies:  Allergies  Allergen Reactions  . Cephalexin Itching  . Codeine Nausea Only    Home Medications: (Not in a hospital admission)   OB/GYN Status:  No LMP recorded. Patient has had a hysterectomy.  General Assessment Data Location of Assessment: [facility] ED TTS Assessment: In system Is this a Tele or Face-to-Face Assessment?: Tele Assessment Is this an Initial Assessment or a Re-assessment for this encounter?: Initial Assessment Patient Accompanied by:: Adult Permission Given to speak with another: Yes Name, Relationship and Phone Number: Tracey Keith [phone] niece/POA Language Other than English: No Living Arrangements: Other (Comment)(Pt has a renter that lives with her.) What gender do you identify as?: Female Marital status: Widowed Tracey Keith name: Lanae Boast Pregnancy Status: No Living Arrangements: Non-relatives/Friends(Non relative lives with her.) Can pt return to current living arrangement?: Yes Admission Status: Voluntary Is patient capable of signing voluntary admission?: Yes Referral Source: Self/Family/Friend Insurance type: [facility]     Crisis Care Plan Living Arrangements: Non-relatives/Friends(Non relative lives with her.) Name of Psychiatrist: None Name of Therapist: None  Education Status Is patient currently in school?: No Is the patient employed, unemployed or receiving disability?: Employed  Risk to self with the past 6 months Suicidal Ideation: No Has patient been a risk to self within the past 6 months prior to admission? : No Suicidal Intent: No Has patient had any suicidal intent within the past 6 months prior to admission? : No Is patient at risk for suicide?: No Suicidal Plan?: No Has patient had any  suicidal plan within the past 6 months prior to admission? : No Access to Means: No What has been your use of drugs/alcohol within the last 12 months?: N/A Previous Attempts/Gestures: No How many times?: 0 Other Self Harm Risks: None Triggers for Past Attempts: None known Intentional Self Injurious Behavior: None Family Suicide History: No Recent stressful life event(s): Turmoil (Comment)(Not being able to drive.) Persecutory voices/beliefs?: Yes Depression: Yes Depression Symptoms: Feeling angry/irritable, Loss of interest in usual pleasures Substance abuse history and/or treatment for substance abuse?: No Suicide prevention information given to non-admitted patients: Not applicable  Risk to Others within the past 6 months Homicidal Ideation: No Does patient have any lifetime risk of violence toward others beyond the six months prior to admission? : No Thoughts of Harm to Others: No Current Homicidal Intent: No Current Homicidal Plan: No Access to Homicidal Means: No Identified Victim: No one History of harm to others?: No Assessment of Violence: None Noted  Violent Behavior Description: None noted Does patient have access to weapons?: No Criminal Charges Pending?: No Does patient have a court date: No Is patient on probation?: No  Psychosis Hallucinations: Visual(Possible visual hallucinations at times.) Delusions: None noted  Mental Status Report Appearance/Hygiene: In scrubs Eye Contact: Unable to Assess Motor Activity: Freedom of movement Speech: Unable to assess Level of Consciousness: Sleeping Mood: Irritable Affect: Unable to Assess Anxiety Level: Severe Thought Processes: Unable to Assess Judgement: Unable to Assess Orientation: Unable to assess Obsessive Compulsive Thoughts/Behaviors: None  Cognitive Functioning Concentration: Poor Memory: Recent Impaired, Remote Impaired Is patient IDD: No Insight: Unable to Assess Impulse Control: Unable to  Assess Appetite: Poor Have you had any weight changes? : No Change Sleep: No Change Total Hours of Sleep: 8 Vegetative Symptoms: None  ADLScreening [facility] Assessment Services) Patient's cognitive ability adequate to safely complete daily activities?: Yes Patient able to express need for assistance with ADLs?: Yes Independently performs ADLs?: Yes (appropriate for developmental age)  Prior Inpatient Therapy Prior Inpatient Therapy: No  Prior Outpatient Therapy Prior Outpatient Therapy: No Does patient have an ACCT team?: No Does patient have Intensive In-House Services?  : No Does patient have [facility] services? : No Does patient have P4CC services?: No  ADL Screening (condition at time of admission) Patient's cognitive ability adequate to safely complete daily activities?: Yes Is the patient deaf or have difficulty hearing?: No Does the patient have difficulty seeing, even when wearing glasses/contacts?: Yes(Wears glasses.) Does the patient have difficulty concentrating, remembering, or making decisions?: Yes Patient able to express need for assistance with ADLs?: Yes Does the patient have difficulty dressing or bathing?: No Independently performs ADLs?: Yes (appropriate for developmental age) Does the patient have difficulty walking or climbing stairs?: No Weakness of Legs: None Weakness of Arms/Hands: None       Abuse/Neglect Assessment (Assessment to be complete while patient is alone) Abuse/Neglect Assessment Can Be Completed: Yes Physical Abuse: Denies Verbal Abuse: Denies Sexual Abuse: Denies Exploitation of patient/patient's resources: Denies Self-Neglect: Denies     Regulatory affairs officer (For Healthcare) Does Patient Have a Medical Advance Directive?: Yes Type of Advance Directive: [place], Living will Copy of [place] in Chart?: No - copy requested Copy of Living Will in Chart?: No - copy requested           Disposition:  Disposition Initial Assessment Completed for this Encounter: Yes Patient referred to: Other (Comment)(To be reviewed by psychiatry in AM)  This service was provided via telemedicine using a 2-way, interactive audio and video technology.  Names of all persons participating in this telemedicine service and their role in this encounter. Name: Lavena Loretto Role: patient  Name: Irving Burton Role: niece / [place]  Name: Curlene Dolphin, M.S. LCAS QP Role: clinician  Name:  Role:     Raymondo Band 08/26/2019 5:45 AM

## 2019-08-26 NOTE — ED Notes (Signed)
Placed in purple scrubs

## 2019-08-26 NOTE — ED Notes (Signed)
Lab reports able to add on urine drug screen to previously collected specimen.

## 2019-08-26 NOTE — ED Provider Notes (Signed)
Medical screening examination/treatment/procedure(s) were conducted as a shared visit with non-physician practitioner(s) and myself.  I personally evaluated the patient during the encounter.  EKG Interpretation  Date/Time:  Thursday August 25 2019 18:41:17 EDT Ventricular Rate:  74 PR Interval:  178 QRS Duration: 114 QT Interval:  416 QTC Calculation: 461 R Axis:   88 Text Interpretation: Normal sinus rhythm Right bundle branch block Abnormal ECG When compared with ECG of 08/21/2009, Right bundle branch block is now present Confirmed by Dione Booze ([id number]) on 08/25/2019 9:29:53 PM     Summer Parthasarathy, Barbara Cower, MD 08/26/19 2229

## 2019-08-26 NOTE — Progress Notes (Signed)
CSW spoke with pt's POA and discussed pt's disposition. She is thinking about placement for pt at either an ALF or SNF. Pt's PCP is working with family on placement and has completed an FL2. She would like to be updated on any changes in disposition.   Wells Guiles, LCSW, LCAS Disposition CSW [facility]/TTS [phone]

## 2019-08-26 NOTE — ED Notes (Signed)
Pt is becoming increasingly more paranoid stating "this is not the place I've been in and I have my stuff missing". Pt becoming increasingly more agitated. Consulting Delo, MD, 1 mg Ativan IV ordered and given.   Staffing Office also contacted to ask if staffing situation has changed. Office currently sorting situation and looking for pt to have a sitter.

## 2019-08-26 NOTE — ED Notes (Signed)
Breakfast Tray ordered  

## 2019-08-26 NOTE — ED Provider Notes (Signed)
[facility] EMERGENCY DEPARTMENT Provider Note   CSN: [id number] Arrival date & time: 08/25/19  1810     History Chief Complaint  Patient presents with  . Weakness    Tracey Keith is a 79 y.o. female with pertinent past medical history of dementia, right bundle branch block, hypertension, osteopenia, hyperlipidemia that presents to emergency department with niece for concerns of patient's health. Patient is demented and paranoid patient is unable to provide history.  Entire history is provided by niece who is POA.  Patient is extremely paranoid on exam and thinks that her niece brought her in to play tricks on her and wants to go back to work.  She is redirectable.  Her niece states that she was diagnosed with dementia about 5 months ago, however for the past 4 days she has been more altered.  She states that she has been driving recklessly and she has now taken away her keys, patient is extremely upset about this.  She also states that she has been noncompliant with her medications and becoming way more hostile to her.  She states that patient called the doctor's office this morning stating that she did not know where she was and that her family was abusing her.  She states that she has been making up stories recently and brought her into the emergency department today because she is worried she cannot take care of herself.  She states that she lives at home with a friend who occasionally checks on her, however he is gone most of the time.  He has been noticing her falling a lot more recently.  Niece is mostly out of town.  She states that she has been working on getting a assisted living facility for her, however it has been a process.  She states that the patient has not been complaining of any pain, fevers, chills, weakness.  Triage note states that there has been weakness, however when I clarified with the neice she states that pt has not been weak. Sees Dr. Iran Ouch for  neurology, who did an MRI about 3 months ago which showed worsening dementia. HPI     Past Medical History:  Diagnosis Date  . 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 GERD (gastroesophageal reflux disease)    w/cough  . Hiatal hernia   . Hip fracture (HCC) 1980's   left  . Hyperlipidemia   . Hypertension   . Memory loss   . Osteoarthritis    hands, finger  . Osteopenia   . RBBB 2013  . Tremor     Patient Active Problem List   Diagnosis Date Noted  . Late onset Alzheimer's disease without behavioral disturbance (HCC) 07/27/2019  . Impaired driving skills 52/84/1324    Past Surgical History:  Procedure Laterality Date  . ABDOMINAL HYSTERECTOMY     many yrs ago  . SMALL INTESTINE SURGERY     for obstruction     OB History   No obstetric history on file.     Family History  Problem Relation Age of Onset  . Tremor Maternal Aunt     Social History   Tobacco Use  . Smoking status: Never Smoker  . Smokeless tobacco: Never Used  Substance Use Topics  . Alcohol use: Never  . Drug use: Never    Home Medications Prior to Admission medications   Medication Sig Start Date End Date Taking? Authorizing Provider  acetaminophen (TYLENOL) 500 MG tablet  Take by mouth.    [provider]  aspirin 81 MG chewable tablet Chew by mouth.    [provider]  atorvastatin (LIPITOR) 80 MG tablet Take by mouth.    [provider]  BIOTIN 5000 PO Take by mouth.    [provider]  CALCIUM-VITAMIN D PO Take by mouth.    [provider]  donepezil (ARICEPT) 5 MG tablet Take 1 tablet (5 mg total) by mouth at bedtime. 07/27/19   Sater, Pearletha Furl, MD  hydrochlorothiazide (HYDRODIURIL) 25 MG tablet  07/15/18   [provider]  LUMIGAN 0.01 % SOLN Place 1 drop into both eyes at bedtime. 06/30/18   [provider]  meloxicam (MOBIC) 7.5 MG tablet  07/26/18   [provider]  METFORMIN HCL PO Take by mouth.     [provider]  mometasone (ELOCON) 0.1 % ointment Apply sparingly once or twice daily 07/03/16   [provider]  Multiple Vitamin (THERA) TABS Take by mouth.    [provider]  Omega-3 Fatty Acids (FISH OIL PO) Take by mouth.    [provider]  omeprazole (PRILOSEC) 40 MG capsule Take by mouth.    [provider]  ONE TOUCH ULTRA TEST test strip USE TO TEST BLOOD SUGARS ONCE DAILY 05/17/18   [provider]  timolol (TIMOPTIC) 0.5 % ophthalmic solution  07/15/18   [provider]    Allergies    Cephalexin and Codeine  Review of Systems   Review of Systems  Constitutional: Negative for chills, diaphoresis, fatigue and fever.  HENT: Negative for congestion, sore throat and trouble swallowing.   Eyes: Negative for pain and visual disturbance.  Respiratory: Negative for cough, shortness of breath and wheezing.   Cardiovascular: Negative for chest pain, palpitations and leg swelling.  Gastrointestinal: Negative for abdominal distention, abdominal pain, diarrhea, nausea and vomiting.  Genitourinary: Negative for difficulty urinating.  Musculoskeletal: Negative for back pain, neck pain and neck stiffness.  Skin: Negative for pallor.  Neurological: Negative for dizziness, speech difficulty, weakness and headaches.  Psychiatric/Behavioral: Positive for agitation, behavioral problems, confusion and hallucinations. Negative for suicidal ideas.    Physical Exam Updated Vital Signs BP 111/62   Pulse 71   Temp 98.5 F (36.9 C) (Oral)   Resp (!) 26   Ht 5\' 7"  (1.702 m)   Wt 49.9 kg   SpO2 99%   BMI 17.23 kg/m   Physical Exam Constitutional:      General: She is not in acute distress.    Appearance: Normal appearance. She is not ill-appearing, toxic-appearing or diaphoretic.  HENT:     Mouth/Throat:     Mouth: Mucous membranes are moist.     Pharynx: Oropharynx is clear.  Eyes:     General: No visual field deficit or  scleral icterus.    Extraocular Movements: Extraocular movements intact.     Pupils: Pupils are equal, round, and reactive to light.  Cardiovascular:     Rate and Rhythm: Normal rate and regular rhythm.     Pulses: Normal pulses.     Heart sounds: Normal heart sounds.  Pulmonary:     Effort: Pulmonary effort is normal. No respiratory distress.     Breath sounds: Normal breath sounds. No stridor. No wheezing, rhonchi or rales.  Chest:     Chest wall: No tenderness.  Abdominal:     General: Abdomen is flat. There is no distension.     Palpations: Abdomen is soft.  Tenderness: There is no abdominal tenderness. There is no guarding or rebound.  Musculoskeletal:        General: No swelling or tenderness. Normal range of motion.     Cervical back: Normal range of motion and neck supple. No rigidity.     Right lower leg: No edema.     Left lower leg: No edema.  Skin:    General: Skin is warm and dry.     Capillary Refill: Capillary refill takes less than 2 seconds.     Coloration: Skin is not pale.  Neurological:     General: No focal deficit present.     Mental Status: She is alert and oriented to person, place, and time.     Cranial Nerves: No cranial nerve deficit, dysarthria or facial asymmetry.     Sensory: Sensation is intact. No sensory deficit.     Motor: Tremor (Bilateral intention tremor) present. No weakness, atrophy or seizure activity.     Coordination: Romberg sign negative. Coordination normal. Finger-Nose-Finger Test normal.  Psychiatric:     Comments: Patient is extremely agitated and paranoid.  Patient keeps stating that her niece is playing tricks on her and she does not even know her niece.       ED Results / Procedures / Treatments   Labs (all labs ordered are listed, but only abnormal results are displayed) Labs Reviewed  BASIC METABOLIC PANEL - Abnormal; Notable for the following components:      Result Value   Glucose, Bld 139 (*)    Creatinine, Ser  1.30 (*)    GFR calc non Af Amer 39 (*)    GFR calc Af Amer 45 (*)    All other components within normal limits  CBC - Abnormal; Notable for the following components:   Hemoglobin 10.6 (*)    HCT 34.4 (*)    All other components within normal limits  URINALYSIS, ROUTINE W REFLEX MICROSCOPIC - Abnormal; Notable for the following components:   Color, Urine STRAW (*)    All other components within normal limits  CBG MONITORING, ED - Abnormal; Notable for the following components:   Glucose-Capillary 140 (*)    All other components within normal limits  RESPIRATORY PANEL BY RT PCR (FLU A&B, COVID)  ETHANOL  RAPID URINE DRUG SCREEN, HOSP PERFORMED  CBG MONITORING, ED    EKG EKG Interpretation  Date/Time:  Thursday August 25 2019 18:41:17 EDT Ventricular Rate:  74 PR Interval:  178 QRS Duration: 114 QT Interval:  416 QTC Calculation: 461 R Axis:   88 Text Interpretation: Normal sinus rhythm Right bundle branch block Abnormal ECG When compared with ECG of 08/21/2009, Right bundle branch block is now present Confirmed by Dione Booze ([id number]) on 08/25/2019 9:29:53 PM   Radiology CT HEAD WO CONTRAST  Result Date: 08/26/2019 CLINICAL DATA:  Generalized weakness times 3-4 days. EXAM: CT HEAD WITHOUT CONTRAST TECHNIQUE: Contiguous axial images were obtained from the base of the skull through the vertex without intravenous contrast. COMPARISON:  March 08, 2009 FINDINGS: Brain: There is mild cerebral atrophy with widening of the extra-axial spaces and ventricular dilatation. There are areas of decreased attenuation within the white matter tracts of the supratentorial brain, consistent with microvascular disease changes. Vascular: No hyperdense vessel or unexpected calcification. Skull: Normal. Negative for fracture or focal lesion. Sinuses/Orbits: No acute finding. Other: None. IMPRESSION: No acute intracranial pathology. Electronically Signed   By: Aram Candela M.D.   On: 08/26/2019 02:45  Procedures Procedures (including critical care time)  Medications Ordered in ED Medications  sodium chloride flush (NS) 0.9 % injection 3 mL (3 mLs Intravenous Given 08/26/19 0332)  LORazepam (ATIVAN) injection 1 mg (1 mg Intravenous Given 08/26/19 0243)  sodium chloride 0.9 % bolus 1,000 mL (0 mLs Intravenous Stopped 08/26/19 0400)    ED Course  I have reviewed the triage vital signs and the nursing notes.  Pertinent labs & imaging results that were available during my care of the patient were reviewed by me and considered in my medical decision making (see chart for details).    MDM Rules/Calculators/A&P                     LINDELL TUSSEY is a 79 y.o. female with pertinent past medical history of dementia, right bundle branch block, hypertension, osteopenia, hyperlipidemia that presents to emergency department with niece for concerns of patient's health.Patient is demented and paranoid patient is unable to provide history.  Entire history is provided by niece who is POA. Pt's altered state most likely due to worsening dementia, but ddx to include  hypoglycemia, hyperglycemia, hypercalcemia, hypernatremia, hyponatremia, uremia, hepatic encephalopathy, hypothyroidism, hyperthyroidism, vitamin B12 or thiamine deficiency, carbon monoxide poisoning, Wilson's disease, Lactic acidosis, KA/HHOS; Infectious: meningitis, encephalitis, bacteremia/sepsis, urinary tract infection, ; Structural: Space-occupying lesion, (brain tumor, subdural hematoma, hydrocephalus,); Vascular: stroke, subarachnoid hemorrhage, coronary ischemia, hypertensive encephalopathy, CNS vasculitis, thrombotic thrombocytopenic purpura, disseminated intravascular coagulation, hyperviscosity; Psychiatric: Schizophrenia, depression; Other: Seizure, hypothermia, heat stroke, dementia.   Patient is currently paranoid.  See nursing note.Will give Ativan. Will IVC at this time. ECG interpreted by me demonstrated sinus rhythm with  RBBB. Labs demonstrated stable CBC and CMP.  Creatinine of 1.3, will give fluids for this.  Normal urinalysis.  Ct head with no acute intracranial pathology. Ethanol, UDS and COVID negative. Home meds ordered.  Did not order Metformin at this time since it has not been reviewed and GFR is 39.TTS consulted for acute psychotic episode, awaiting their disposition.    I discussed this case with my attending physician,Dr. Mesner, who cosigned this note including patient's presenting symptoms, physical exam, and planned diagnostics and interventions. Attending physician stated agreement with plan or made changes to plan which were implemented.   Attending physician assessed patient at bedside.  Pt care was handed off to [employer] PA-C at [place].  Complete history and physical and current plan have been communicated.  Please refer to their note for the remainder of ED care and ultimate disposition.    Final Clinical Impression(s) / ED Diagnoses Final diagnoses:  Psychosis, unspecified psychosis type ([place])  Dementia with behavioral disturbance, unspecified dementia type [facility])    Rx / DC Orders ED Discharge Orders    None       Alfredia Client, PA-C 08/26/19 [id number]    Mesner, Corene Cornea, MD 08/26/19 [phone]

## 2019-08-26 NOTE — ED Notes (Signed)
$  900 in various forms returned to patients daughter.

## 2019-08-26 NOTE — Progress Notes (Signed)
Pt meets inpatient criteria. Referral information has been sent to the following hospitals for review:  [facility] Details [facility] Details  [facility] Details [facility] Details [facility] Details  [facility]  Disposition will continue to assist with inpatient placement needs.   Wells Guiles, LCSW, LCAS Disposition CSW [facility]/TTS [phone]

## 2019-08-26 NOTE — ED Notes (Signed)
Pt still in hospital gown. Pt wanded by security, until can get her in purple scrubs

## 2019-08-26 NOTE — ED Notes (Signed)
TTS machine at bedside. 

## 2019-08-27 NOTE — ED Notes (Signed)
Per [place], [place] - pt has been accepted - may arrive after 7pm - advised Deputy will not transport this late so advised may arrive tomorrow (08/28/19). Accepting MD - Kevan Ny - Call report - [phone] - ask for Geriatric nurses' station. Advised will call back if pt may arrive today but doubtful. Advised she left a VM for [facility] SW advising of acceptance as well.

## 2019-08-27 NOTE — ED Notes (Addendum)
[address] called inquiring about pt. Advised will contact [facility] to advise if accepted. Pt sitting in recliner in hallway. Sitter w/pt. Pt noted to be eating breakfast - feeding self. Pt asking same repetitive questions.

## 2019-08-27 NOTE — ED Notes (Signed)
Stretcher changed out for bed for pt comfort. Pt noted to be lying on bed w/eyes closed. Respirations even, unlabored.

## 2019-08-27 NOTE — ED Notes (Signed)
Pt's niece, Dennie Bible - who is pt's POA - took ALL of pt's belongings - 2 labeled belongings bags including pt's purse. Advised she had took pt's money yesterday. NO Belongings left in ED for pt. Pat voiced understanding of tx plan - Accepted to [facility] and will be transported tomorrow. Recommended for her to bring pt shoes w/no laces - advised she will bring them.

## 2019-08-27 NOTE — ED Notes (Signed)
Pt able to state year and her name. Pt states she is unsure how she got to ED nor why she came. States "Eduardo Osier is my renter and watches out for me" and "I work at the [employer] - Unable to state what she does there. States "I remember it's a lot of paperwork. I don't want to lose my job because I would be very bored at home". Pt sitting up in bed talking w/Sitter. Pt continues to ask same questions repeatedly.

## 2019-08-27 NOTE — Progress Notes (Signed)
Patient meets criteria for inpatient treatment. No appropriate beds at [facility] currently. CSW faxed referrals to the following facilities for review:  [facility]   [facility]   [facility]   [facility]   [facility]   [facility]   [facility]   [facility]     TTS will continue to seek bed placement.     Ruthann Cancer MSW, [facility] Clincal Social Worker Disposition  [facility] Ph: [phone] Fax: [phone] 08/27/2019 9:12 AM

## 2019-08-27 NOTE — ED Notes (Signed)
Deputy called and advised he received message re: need for pt to be transported to [facility] tomorrow (08/28/19). Advised Deputy will be calling in AM to advise time of transportation.

## 2019-08-27 NOTE — ED Notes (Signed)
Pt's niece has arrived to visit.

## 2019-08-27 NOTE — ED Notes (Signed)
Pt ambulated to bathroom and back to room w/o difficulty. Sitter w/pt. 

## 2019-08-28 NOTE — ED Notes (Signed)
Attempted to call report to [place] - [phone] - RN to return call. Deputy called and advised she is en route - approx 45 minutes. Pt w/no belongings in ED other than her shoes and she is wearing her eyeglasses.

## 2019-08-28 NOTE — ED Notes (Signed)
[facility] 08/28/19  bfast ordered

## 2019-08-28 NOTE — ED Provider Notes (Signed)
9:57 AM Nursing called to request EMTALA documentation to filled out as patient has been accepted to [facility] under the care of Dr. Molli Hazard for further management of paranoia and dementia.  I went and assessed the patient myself just now and she has no complaints.  She is resting comfortably working on crossword puzzles and watching television.  She denies any physical complaints including specifically denying chest pain, shortness of breath, palpitations or or abdominal pain.  She has no pain at all.  On my exam, lungs clear and chest and abdomen nontender.  Patient resting comfortably.  Patient informed that she would likely be transferred to a different facility today for further management.      Janny Crute, Canary Brim, MD 08/28/19 [phone]

## 2019-09-21 ENCOUNTER — Ambulatory Visit: Payer: Medicare Other | Admitting: Family Medicine

## 2019-10-27 ENCOUNTER — Ambulatory Visit: Payer: Medicare Other | Admitting: Neurology

## 2019-12-29 ENCOUNTER — Other Ambulatory Visit: Payer: Self-pay

## 2019-12-29 ENCOUNTER — Encounter: Payer: Self-pay | Admitting: Neurology

## 2019-12-29 ENCOUNTER — Ambulatory Visit: Payer: Medicare Other | Admitting: Neurology

## 2019-12-29 VITALS — BP 125/60 | HR 64 | Ht 67.0 in | Wt 106.5 lb

## 2019-12-29 DIAGNOSIS — Z789 Other specified health status: Secondary | ICD-10-CM

## 2019-12-29 DIAGNOSIS — G301 Alzheimer's disease with late onset: Secondary | ICD-10-CM

## 2019-12-29 DIAGNOSIS — F0280 Dementia in other diseases classified elsewhere without behavioral disturbance: Secondary | ICD-10-CM | POA: Diagnosis not present

## 2019-12-29 DIAGNOSIS — G250 Essential tremor: Secondary | ICD-10-CM

## 2019-12-29 MED ORDER — DONEPEZIL HCL 10 MG PO TABS: 10.0000 mg | ORAL_TABLET | Freq: Every day | ORAL | 3 refills | Status: AC

## 2019-12-29 NOTE — Progress Notes (Signed)
[facility]  PATIENT: Tracey Keith DOB: 1940/09/08  REFERRING DOCTOR OR PCP: Guerry Bruin MD SOURCE: Patient, notes from primary care, lab reports.  _________________________________   HISTORICAL  CHIEF COMPLAINT:  Chief Complaint  Patient presents with  . Follow-up    RM 10 with daughter. Last seen 07/27/2019. Last [facility] 09/30. Feels memory has worsened. Does not feel aricept is doing any good. I did educate to let her know it will not improve memory but rather help slow process of memory loss. She understands but would like to discuss options.     HISTORY OF PRESENT ILLNESS:  Tracey Keith ia a 79 y.o. 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  Update 12/29/2019: She is taking donepezil 5 mg for the AD but will miss some doses.    Her daughter sets up the pill boxes.     She is forgetful but is independent insrumental care ADLs well.  She goes shopping weekly with a friend.   Her daughter stops by once a week for dinner and shopping as well.   She picks out items at the food stores.   She is outside daily.  She is staying active and walks on her street.   She has not gotten lost.  She always has her cell phone with her.      Mood is better than last time and she is less angry.   She has accepted that she needed to stop driving.    Update 07/27/2019: Since the last visit, she was prescribed donepezil and has had an imaging study.  She has not been taking any recently.    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   She drives herself and has been lost a few times.   We again discussed that she should not drive.     MRI of the brain was personally reviewed.  It shows generalized cortical atrophy that is moderately severe in the medial temporal lobes and milder elsewhere.  The atrophy has occurred since a 2010 CT scan.  She also has some scattered T2/FLAIR hyperintense foci in the hemispheres consistent with  minimal chronic microvascular ischemic change, typical for age.   From initial consultation 06/23/2019: She is an 79 year old woman who was noted by her family to have progressive memory loss over the past 2 years.   Her niece was first concerned 14 months ago when she got lost driving to a relative's house.    She also reports that her aunt has gotten lost while driving.    She does not always remember to use the GPS.   She was 'missing' for a few days when no one was able to get in touch with her.   She has forgotten to take medications and has not been regularly checking blood sugar.     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  She is driving daily including [address] (back and forth between home and work and to the stores) and denies any difficulties.    She reports she does not have to balance any books.  She denies that she has had any problems doing paperwork for her job.    She has had some tremors I her hands.  She has had a few falls.   She does not remember any details of the falls  [facility] Cognitive Assessment  06/23/2019  Visuospatial/ Executive (0/5) 1  Naming (0/3) 3  Attention: Read list of digits (0/2) 1  Attention: Read list of letters (0/1) 1  Attention: Serial 7 subtraction starting at 100 (0/3) 0  Language: Repeat phrase (0/2) 0  Language : Fluency (0/1) 0  Abstraction (0/2) 1  Delayed Recall (0/5) 0  Orientation (0/6) 2  Total 9     REVIEW OF SYSTEMS: Constitutional: No fevers, chills, sweats, or change in appetite Eyes: No visual changes, double vision, eye pain Ear, nose and throat: No hearing loss, ear pain, nasal congestion, sore throat Cardiovascular: No chest pain, palpitations Respiratory: No shortness of breath at rest or with exertion.   No wheezes GastrointestinaI: No nausea, vomiting, diarrhea, abdominal pain, fecal incontinence Genitourinary: No dysuria, urinary retention or frequency.   No nocturia. Musculoskeletal: No neck pain, back pain Integumentary: No rash, pruritus, skin lesions Neurological: as above Psychiatric: No depression at this time.  No anxiety Endocrine: No palpitations, diaphoresis, change in appetite, change in weigh or increased thirst Hematologic/Lymphatic: No anemia, purpura, petechiae. Allergic/Immunologic: No itchy/runny eyes, nasal congestion, recent allergic reactions, rashes  ALLERGIES: Allergies  Allergen Reactions  . Cephalexin Itching  . Codeine Nausea Only    HOME MEDICATIONS:  Current Outpatient Medications:  .  acetaminophen (TYLENOL) 500 MG tablet, Take 500 mg by mouth every 4 (four) hours as needed for mild pain. , Disp: , Rfl:  .  atorvastatin (LIPITOR) 80 MG tablet, Take 80 mg by mouth daily. , Disp: , Rfl:  .  donepezil (ARICEPT) 10 MG tablet, Take 1 tablet (10 mg total) by mouth at bedtime., Disp: 90 tablet, Rfl: 3 .  hydrochlorothiazide (HYDRODIURIL) 25 MG tablet, Take 25 mg by mouth daily. , Disp: , Rfl:  .  metFORMIN (GLUCOPHAGE) 500 MG tablet, Take 500 mg by mouth in the morning and at bedtime. , Disp: , Rfl:  .  Multiple Vitamin (THERA) TABS, Take 1 tablet by mouth daily. , Disp: , Rfl:  .  omeprazole (PRILOSEC) 40 MG capsule, Take 40 mg by mouth 2 (two) times daily as needed Genella Rife). , Disp: , Rfl:  .  ONE TOUCH ULTRA TEST test strip, USE TO TEST BLOOD SUGARS ONCE DAILY, Disp: , Rfl:  .  timolol (TIMOPTIC) 0.5 % ophthalmic solution, Place 1 drop into both eyes 2 (two) times daily. , Disp: , Rfl:   PAST MEDICAL HISTORY: Past Medical History:  Diagnosis Date  . 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 GERD (gastroesophageal reflux disease)    w/cough  . Hiatal hernia   . Hip fracture (HCC) 1980's   left  . Hyperlipidemia   . Hypertension   . Memory loss   . Osteoarthritis    hands, finger  . Osteopenia   . RBBB 2013  . Tremor     PAST SURGICAL HISTORY:   FAMILY HISTORY: Family History    Problem Relation Age of Onset  . Tremor Maternal Aunt     SOCIAL HISTORY:  Social History   Socioeconomic History  . Marital status: Single    Spouse name: Not on file  . Number of children: 1  . Years of education: Not on file  . 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 Smoking status: Never Smoker  . Smokeless tobacco: Never Used  Substance and Sexual Activity  .  Alcohol use: Never  . Drug use: Never  . Sexual activity: Not on file  Other Topics Concern  . Not on file  Social History Narrative   06/23/19 lives with son's friend, Alinda Moneyony, is gone a lot for work   Garment/textile technologistat, HC/POA lives 30 min away   Social Determinants of Health   Financial Resource Strain:   . Difficulty of Paying Living Expenses: Not on file  Food Insecurity:   . Worried About Programme researcher, broadcasting/film/videounning Out of Food in the Last Year: Not on file  . Ran Out of Food in the Last Year: Not on file  Transportation Needs:   . Lack of Transportation (Medical): Not on file  . Lack of Transportation (Non-Medical): Not on file  Physical Activity:   . Days of Exercise per Week: Not on file  . Minutes of Exercise per Session: Not on file  Stress:   . Feeling of Stress : Not on file  Social Connections:   . Frequency of Communication with Friends and Family: Not on file  . Frequency of Social Gatherings with Friends and Family: Not on file  . Attends Religious Services: Not on file  . Active Member of Clubs or Organizations: Not on file  . Attends BankerClub or Organization Meetings: Not on file  . Marital Status: Not on file  Intimate Partner Violence:   . Fear of Current or Ex-Partner: Not on file  . Emotionally Abused: Not on file  . Physically Abused: Not on file  . Sexually Abused: Not on file     PHYSICAL EXAM  Vitals:   12/29/19 1506  BP: 125/60  Pulse: 64  SpO2: 93%  Weight: 106 lb 8 oz (48.3  kg)  Height: 5\' 7"  (1.702 m)    Body mass index is 16.68 kg/m.   General: The patient is well-developed and well-nourished and in no acute distress  HEENT:  Head is [place]/AT.  Sclera are anicteric.   Skin: Extremities are without rash or  edema.   Neurologic Exam  Mental status: The patient is alert and oriented to name.   Wrong year (2020) and wrong month (July not September) knows doctors office but not my name.  Short-term memory was 3/3 at 1 1/2 minutes and calculations are ok (20-17-14-11-8).  Attention is poor.   Speech is normal.  Cranial nerves: Extraocular movements are full. .  Facial symmetry is present. There is good facial sensation to soft touch bilaterally.Facial strength is normal.  Trapezius and sternocleidomastoid strength is normal. No dysarthria is noted.    No obvious hearing deficits are noted.  Motor:  Muscle bulk is normal.   Tone is normal. Strength is  5 / 5 in all 4 extremities.   Sensory: Sensory testing is intact to pinprick, soft touch and vibration sensation in all 4 extremities.  Coordination: Cerebellar testing reveals good finger-nose-finger and heel-to-shin bilaterally.  Gait and station: Station is normal.  The gait is mildly arthritic and mildly wide.. Tandem gait is mildly wide but probably normal for age. Romberg is negative.   Reflexes: Deep tendon reflexes are symmetric and normal bilaterally.         ASSESSMENT AND PLAN  Late onset Alzheimer's disease without behavioral disturbance (HCC)  Impaired driving skills  Essential tremor  1.  She has stopped driving and stopped working.  She is trying to remain active 2.  Donepezil 10 mg  I would also consider adding memantine in the future. 3.    tremor is stable and  does not need to be treated 4.    Return in 6 months or sooner for new or worsening neurologic symptoms.   Deangela Randleman A. Epimenio Foot, MD, PhD, [place] 12/29/2019, 4:04 PM Certified in Neurology, Clinical Neurophysiology, Sleep Medicine  and Neuroimaging  [facility] [address] [place], [place] [address] [phone]

## 2020-01-26 ENCOUNTER — Ambulatory Visit: Payer: Medicare Other | Admitting: Neurology

## 2020-10-10 ENCOUNTER — Telehealth: Payer: Self-pay | Admitting: Neurology

## 2020-10-10 NOTE — Telephone Encounter (Signed)
Tracey Keith niece of pt on DPR called and LVM stating that the pt's dementia is getting worse and they are wanting to know if the pt can be seen soon. They are needing a 3pm appointment.

## 2020-10-10 NOTE — Telephone Encounter (Signed)
Called Pat back. Advised MD out until next Tuesday. Offered 10/22/20 at 3pm w/ MD. She accepted. I scheduled appt. Asked they bring updated med list, insurance cards and to make sure to wear a mask to appt. She verbalized understanding and appreciation.

## 2020-10-22 ENCOUNTER — Ambulatory Visit: Payer: Medicare Other | Admitting: Neurology

## 2020-10-22 ENCOUNTER — Encounter: Payer: Self-pay | Admitting: Neurology

## 2020-10-22 VITALS — BP 123/60 | HR 62 | Ht 67.0 in | Wt 106.5 lb

## 2020-10-22 DIAGNOSIS — F0280 Dementia in other diseases classified elsewhere without behavioral disturbance: Secondary | ICD-10-CM

## 2020-10-22 DIAGNOSIS — G301 Alzheimer's disease with late onset: Secondary | ICD-10-CM | POA: Diagnosis not present

## 2020-10-22 DIAGNOSIS — R269 Unspecified abnormalities of gait and mobility: Secondary | ICD-10-CM | POA: Diagnosis not present
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
## 2020-11-02 ENCOUNTER — Telehealth: Payer: Self-pay | Admitting: Neurology

## 2020-11-02 NOTE — Telephone Encounter (Signed)
Pt's niece Dennie Bible on [place] called stating the form Dr. Epimenio Foot filled out, he checked the wrong box. He put skilled nursing facility however pt's niece says it needs to be domiciliary in order to get her in the facility. Please give niece a call back.

## 2020-11-05 NOTE — Telephone Encounter (Signed)
Stanton Kidney- can you call her back and let her know she will need to bring in form for [place] to adjust this for them. Thank you

## 2020-11-15 NOTE — Telephone Encounter (Signed)
Faxed completed/signed FL2 form back to [facility] (w/ domiciliary checked) at [phone]. Received fax confirmation.  Phone: [phone].

## 2021-01-02 ENCOUNTER — Ambulatory Visit: Payer: Medicare Other | Admitting: Neurology

## 2021-05-06 ENCOUNTER — Ambulatory Visit: Payer: Medicare Other | Admitting: Family Medicine

## 2021-05-06 ENCOUNTER — Encounter: Payer: Self-pay | Admitting: Family Medicine
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
## 2021-05-06 NOTE — Patient Instructions (Incomplete)

## 2021-08-14 ENCOUNTER — Other Ambulatory Visit: Payer: Self-pay

## 2021-08-14 ENCOUNTER — Emergency Department
Admission: EM | Admit: 2021-08-14 | Discharge: 2021-08-15 | Disposition: A | Discharge status: Home / Self Care | Payer: Medicare Other | Attending: Emergency Medicine | Admitting: Emergency Medicine

## 2021-08-14 DIAGNOSIS — R42 Dizziness and giddiness: Secondary | ICD-10-CM | POA: Diagnosis present

## 2021-08-14 DIAGNOSIS — G309 Alzheimer's disease, unspecified: Secondary | ICD-10-CM | POA: Diagnosis not present

## 2021-08-14 DIAGNOSIS — I10 Essential (primary) hypertension: Secondary | ICD-10-CM | POA: Diagnosis not present

## 2021-08-14 LAB — URINALYSIS, ROUTINE W REFLEX MICROSCOPIC
Bilirubin Urine: NEGATIVE
Glucose, UA: 150 mg/dL — AB
Hgb urine dipstick: NEGATIVE
Ketones, ur: NEGATIVE mg/dL
Leukocytes,Ua: NEGATIVE
Nitrite: NEGATIVE
Protein, ur: NEGATIVE mg/dL
Specific Gravity, Urine: 1.005 (ref 1.005–1.030)
pH: 5 (ref 5.0–8.0)

## 2021-08-14 LAB — BASIC METABOLIC PANEL
Anion gap: 12 (ref 5–15)
BUN: 33 mg/dL — ABNORMAL HIGH (ref 8–23)
CO2: 26 mmol/L (ref 22–32)
Calcium: 9 mg/dL (ref 8.9–10.3)
Chloride: 96 mmol/L — ABNORMAL LOW (ref 98–111)
Creatinine, Ser: 1.87 mg/dL — ABNORMAL HIGH (ref 0.44–1.00)
GFR, Estimated: 27 mL/min — ABNORMAL LOW (ref >60)
Glucose, Bld: 289 mg/dL — ABNORMAL HIGH (ref 70–99)
Potassium: 4.2 mmol/L (ref 3.5–5.1)
Sodium: 134 mmol/L — ABNORMAL LOW (ref 135–145)

## 2021-08-14 LAB — CBC
HCT: 32.1 % — ABNORMAL LOW (ref 36.0–46.0)
Hemoglobin: 10.7 g/dL — ABNORMAL LOW (ref 12.0–15.0)
MCH: 29.3 pg (ref 26.0–34.0)
MCHC: 33.3 g/dL (ref 30.0–36.0)
MCV: 87.9 fL (ref 80.0–100.0)
Platelets: 155 10*3/uL (ref 150–400)
RBC: 3.65 MIL/uL — ABNORMAL LOW (ref 3.87–5.11)
RDW: 12.1 % (ref 11.5–15.5)
WBC: 6.3 10*3/uL (ref 4.0–10.5)
nRBC: 0 % (ref 0.0–0.2)

## 2021-08-14 MED ORDER — LACTATED RINGERS IV BOLUS
1000.0000 mL | Freq: Once | INTRAVENOUS | Status: AC
  Administered 2021-08-14: 1000 mL via INTRAVENOUS

## 2021-08-14 NOTE — ED Notes (Signed)
Pt ambulated in room with no assistance 

## 2021-08-14 NOTE — ED Provider Notes (Addendum)
? ?[facility] ?Provider Note ? ? ? Event Date/Time  ? First MD Initiated Contact with Patient 08/14/21 2216   ?  (approximate) ? ? ?History  ? ?Dizziness ? ? ?HPI ? ?Tracey Keith is a 81 y.o. female who presents to the ED for evaluation of Dizziness ?  ?I reviewed neurology visit from 2 years ago.  History of Alzheimer's disease, HLD, HTN and GERD. ? ?Patient's niece and POA brings her to the ED for evaluation of episodic dizziness throughout the day today. ? ?Niece came to bring the patient up to lunch and get her hair done earlier today, and has spent much of the day today with her.  Niece reports that patient had complaints of dizziness and unsteadiness when ambulating out of the car to get her hair done, then back out to the car after this.  Every bout of standing from a seated position throughout the day today, including going to dinner and returning back to her assisted living facility, patient had complaints of dizziness with standing. ? ?Patient reports that she is acting fine otherwise, behaving normally between these episodes while she is seated.  At her known demented and mild disorientation baseline. ? ?Here in the ED, patient reports that she feels fine and does not know why she is here.  Pleasantly disoriented. ? ?Physical Exam  ? ?Triage Vital Signs: ?ED Triage Vitals  ?Enc Vitals Group  ?   BP 08/14/21 1850 111/69  ?   Pulse Rate 08/14/21 1850 60  ?   Resp 08/14/21 1850 18  ?   Temp 08/14/21 1850 98 ?F (36.7 ?C)  ?   Temp Source 08/14/21 1850 Oral  ?   SpO2 08/14/21 1850 98 %  ?   Weight --   ?   Height 08/14/21 1851 5\' 7"  (1.702 m)  ?   Head Circumference --   ?   Peak Flow --   ?   Pain Score 08/14/21 1850 0  ?   Pain Loc --   ?   Pain Edu? --   ?   Excl. in GC? --   ? ? ?Most recent vital signs: ?Vitals:  ? 08/14/21 1850 08/14/21 2209  ?BP: 111/69 135/72  ?Pulse: 60 (!) 58  ?Resp: 18 17  ?Temp: 98 ?F (36.7 ?C)   ?SpO2: 98% 100%  ? ? ?General: Awake, no distress.   Pleasantly disoriented to situation and time.  Follows commands in all 4.  No complaints. ?CV:  Good peripheral perfusion.  ?Resp:  Normal effort.  ?Abd:  No distention.  Soft and benign ?MSK:  No deformity noted.  ?Neuro:  No focal deficits appreciated. Cranial nerves II through XII intact ?5/5 strength and sensation in all 4 extremities ?Other:   ? ? ?ED Results / Procedures / Treatments  ? ?Labs ?(all labs ordered are listed, but only abnormal results are displayed) ?Labs Reviewed  ?BASIC METABOLIC PANEL - Abnormal; Notable for the following components:  ?    Result Value  ? Sodium 134 (*)   ? Chloride 96 (*)   ? Glucose, Bld 289 (*)   ? BUN 33 (*)   ? Creatinine, Ser 1.87 (*)   ? GFR, Estimated 27 (*)   ? All other components within normal limits  ?CBC - Abnormal; Notable for the following components:  ? RBC 3.65 (*)   ? Hemoglobin 10.7 (*)   ? HCT 32.1 (*)   ? All other components within normal  limits  ?URINALYSIS, ROUTINE W REFLEX MICROSCOPIC - Abnormal; Notable for the following components:  ? Color, Urine STRAW (*)   ? APPearance CLEAR (*)   ? Glucose, UA 150 (*)   ? All other components within normal limits  ?CBG MONITORING, ED  ? ? ?EKG ?Sinus rhythm, rate of 64 bpm.  Normal axis.  Right bundle.  No STEMI. ? ?RADIOLOGY ? ? ?Official radiology report(s): ?No results found. ? ?PROCEDURES and INTERVENTIONS: ? ?.1-3 Lead EKG Interpretation ?Performed by: Delton Prairie, MD ?Authorized by: Delton Prairie, MD  ? ?  Interpretation: normal   ?  ECG rate:  60 ?  ECG rate assessment: normal   ?  Rhythm: sinus rhythm   ?  Ectopy: none   ?  Conduction: normal   ? ?Medications  ?lactated ringers bolus 1,000 mL (0 mLs Intravenous Stopped 08/14/21 2348)  ? ? ? ?IMPRESSION / MDM / ASSESSMENT AND PLAN / ED COURSE  ?I reviewed the triage vital signs and the nursing notes. ? ?81year old female presents to the ED with positional dizziness. She has reassuring vital signs and examination. No neurologic or vascular deficits.  Asymptomatic here in the ED and looks well. Blood work with no leukocytosis, but metabolic panel with worsening renal dysfunction. No clear baseline value in the chart of creatinine. Urine without infectious features. I suspect a degree of dehydration, hypovolemia and possibly AKI. We will provide IV fluids and ambulate the patient, if feeling better then would likely be suitable for return to ALF.  ? ?Shortly after signout while I am still here, patient ambulates with the nurse with improved symptoms and no dizziness.  The niece was not in the room during this, so I returned to the bedside and patient reports feeling fine.  She has finished her IV fluids.  I stand the patient up and we ambulate around the room together with the niece and niece reports that she does seem better and patient again denies any dizziness.  We discussed plan of care and they are agreeable to outpatient management.  I certainly considered observation admission for this patient but we ultimately decided that outpatient management is preferable.  We discussed return precautions. ? ?Clinical Course as of 08/14/21 2356  ?Wed Aug 14, 2021  ?2309 Pt signed out to oncoming provider pending ambulation trial and reassessment  [DS]  ?  ?Clinical Course User Index ?[DS] Delton Prairie, MD  ? ? ? ?FINAL CLINICAL IMPRESSION(S) / ED DIAGNOSES  ? ?Final diagnoses:  ?Dizziness  ? ? ? ?Rx / DC Orders  ? ?ED Discharge Orders   ? ? None  ? ?  ? ? ? ?Note:  This document was prepared using Dragon voice recognition software and may include unintentional dictation errors. ?  ?Delton Prairie, MD ?08/14/21 2315 ? ?  ?Delton Prairie, MD ?08/14/21 2356 ? ?

## 2021-08-14 NOTE — ED Triage Notes (Signed)
Patient to ER via POV with complaints of dizziness that started around 3pm this afternoon.  ? ?Patient was able to eat a good dinner, but afterwards didn't feel well enough to get to the car. Family member reports patient was having to hold onto multiple things in order to not fall. Patient returned to her assistive living facility but was so dizzy her family member had to hold her up so she didn't fall. Patient having a difficult time remembering feeling so badly, but states now feeling dizzy and her legs feeling "swimmy".  ? ?Denies other symptoms. Some dementia.  ?

## 2021-08-14 NOTE — ED Notes (Signed)
First Nurse Note:  [place] [facility] [place] and feeling faint. 12 lead with EMS showed RBBB, CBG 309, pt has hx/o DM. BP: 128/62, 60 NSR. Pt is in NAD.  ?

## 2021-09-05 NOTE — Patient Instructions (Signed)
Below is our plan: ? ?We will continue donepezil 10mg  daily. Start memantine 5mg  twice daily. I would have you take 1 tablet daily at bedtime for a week. If well tolerated, increase dose to 10mg  twice daily.  ? ?Please make sure you are staying well hydrated. I recommend 50-60 ounces daily. Well balanced diet and regular exercise encouraged. Consistent sleep schedule with 6-8 hours recommended.  ? ?Please continue follow up with care team as directed.  ? ?Follow up with me in 6 months ? ?You may receive a survey regarding today's visit. I encourage you to leave honest feed back as I do use this information to improve patient care. Thank you for seeing me today!  ? ?Management of Memory Problems ?  ?There are some general things you can do to help manage your memory problems.  Your memory may not in fact recover, but by using techniques and strategies you will be able to manage your memory difficulties better. ?  ?1)  Establish a routine. ?Try to establish and then stick to a regular routine.  By doing this, you will get used to what to expect and you will reduce the need to rely on your memory.  Also, try to do things at the same time of day, such as taking your medication or checking your calendar first thing in the morning. ?Think about think that you can do as a part of a regular routine and make a list.  Then enter them into a daily planner to remind you.  This will help you establish a routine. ?  ?2)  Organize your environment. ?Organize your environment so that it is uncluttered.  Decrease visual stimulation.  Place everyday items such as keys or cell phone in the same place every day (ie.  Basket next to front door) ?Use post it notes with a brief message to yourself (ie. Turn off light, lock the door) ?Use labels to indicate where things go (ie. Which cupboards are for food, dishes, etc.) ?Keep a notepad and pen by the telephone to take messages ?  ?3)  Memory Aids ?A diary or journal/notebook/daily  planner ?Making a list (shopping list, chore list, to do list that needs to be done) ?Using an alarm as a reminder (kitchen timer or cell phone alarm) ?Using cell phone to store information (Notes, Calendar, Reminders) ?Calendar/White board placed in a prominent position ?Post-it notes ?  ?In order for memory aids to be useful, you need to have good habits.  It's no good remembering to make a note in your journal if you don't remember to look in it.  Try setting aside a certain time of day to look in journal. ?  ?4)  Improving mood and managing fatigue. ?There may be other factors that contribute to memory difficulties.  Factors, such as anxiety, depression and tiredness can affect memory. ?Regular gentle exercise can help improve your mood and give you more energy. ?Simple relaxation techniques may help relieve symptoms of anxiety ?Try to get back to completing activities or hobbies you enjoyed doing in the past. ?Learn to pace yourself through activities to decrease fatigue. ?Find out about some local support groups where you can share experiences with others. ?Try and achieve 7-8 hours of sleep at night. ? ?
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
## 2021-09-09 ENCOUNTER — Ambulatory Visit: Payer: Medicare Other | Admitting: Family Medicine

## 2021-09-09 ENCOUNTER — Encounter: Payer: Self-pay | Admitting: Family Medicine

## 2021-09-09 DIAGNOSIS — F0280 Dementia in other diseases classified elsewhere without behavioral disturbance: Secondary | ICD-10-CM | POA: Diagnosis not present

## 2021-09-09 DIAGNOSIS — G301 Alzheimer's disease with late onset: Secondary | ICD-10-CM | POA: Diagnosis not present

## 2021-09-09 MED ORDER — MEMANTINE HCL 5 MG PO TABS: 5.0000 mg | ORAL_TABLET | Freq: Two times a day (BID) | ORAL | 1 refills | Status: DC

## 2021-11-12 IMAGING — CT CT HEAD W/O CM
3 series · 16 of 40 positions shown, 18 images · non-contrast
Comparison: March 08, 2009

CLINICAL DATA: Generalized weakness times 3-4 days.

EXAM:
CT HEAD WITHOUT CONTRAST
TECHNIQUE: Contiguous axial images were obtained from the base of the skull
through the vertex without intravenous contrast.

[Series 3: head without · axial · non-contrast · 0.40mm/px · z∈[-119,+1]mm · 7 of 32 slices shown, 9 images]
[im 4/32  brain]
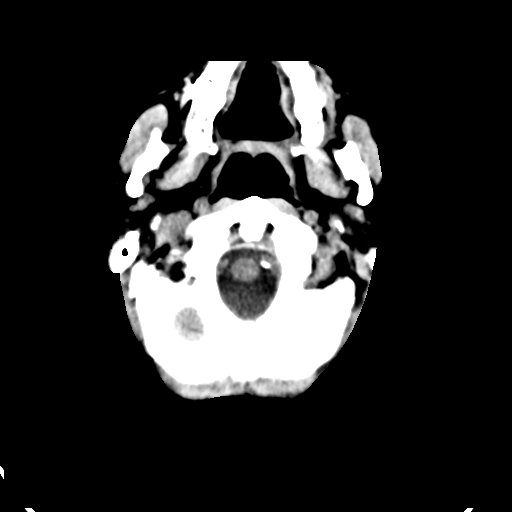
[im 4/32  bone]
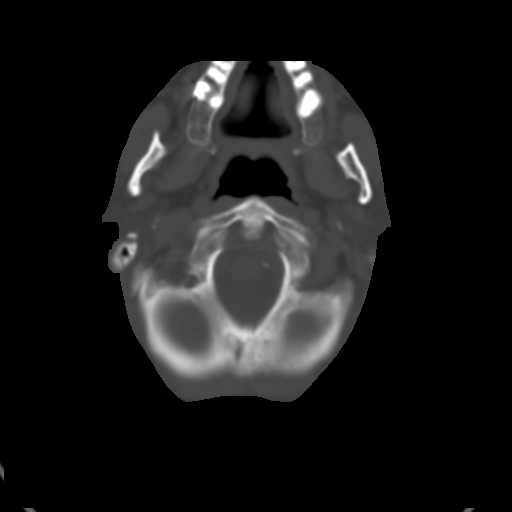
[im 8/32  brain]
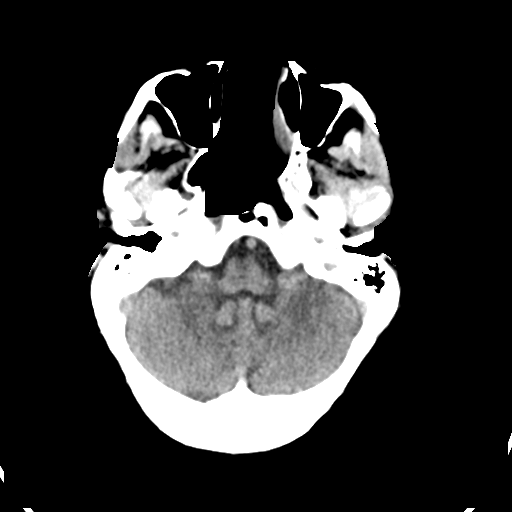
[im 12/32  brain]
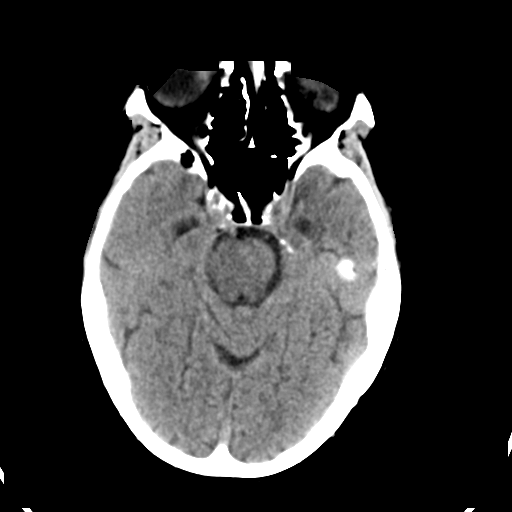
[im 16/32  brain]
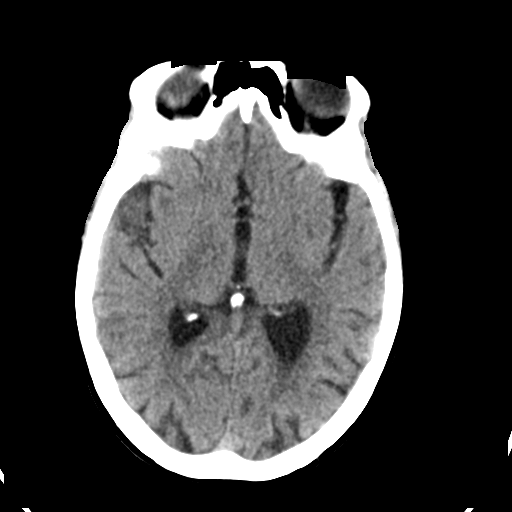
[im 20/32  brain]
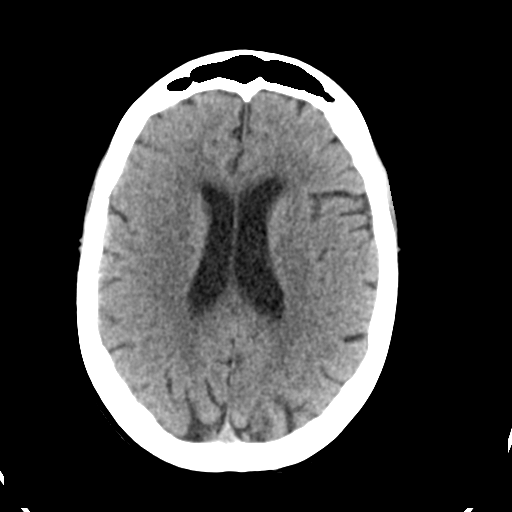
[im 20/32  bone]
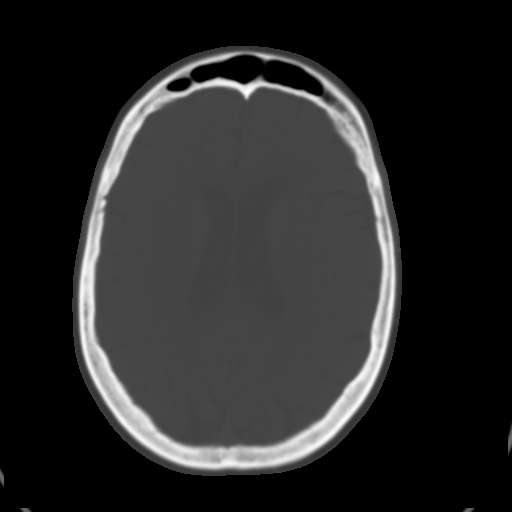
[im 24/32  brain]
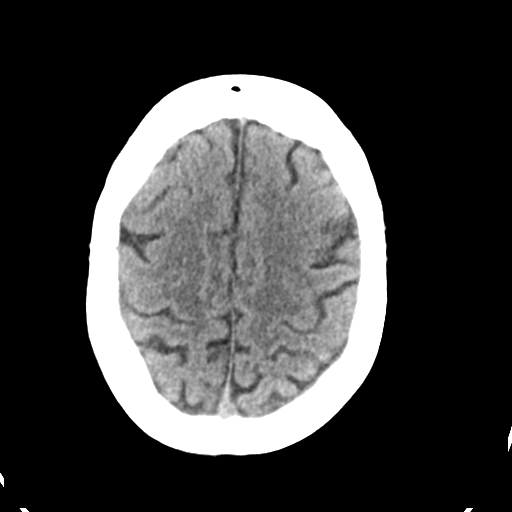
[im 28/32  brain]
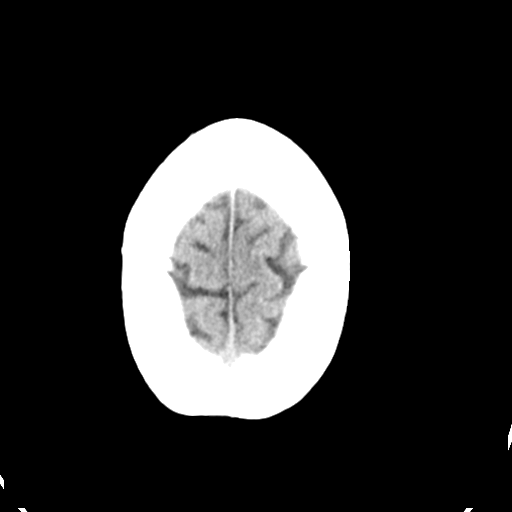

[Series 4: head bone · axial · 0.40mm/px · z∈[-120,-24]mm · 6 of 80 slices shown]
[im 8/80  bone]
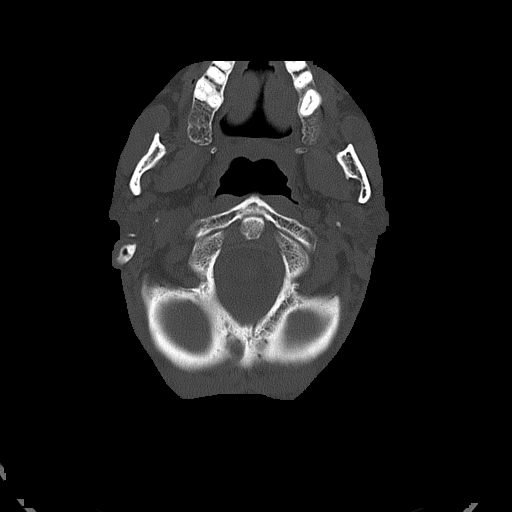
[im 16/80  bone]
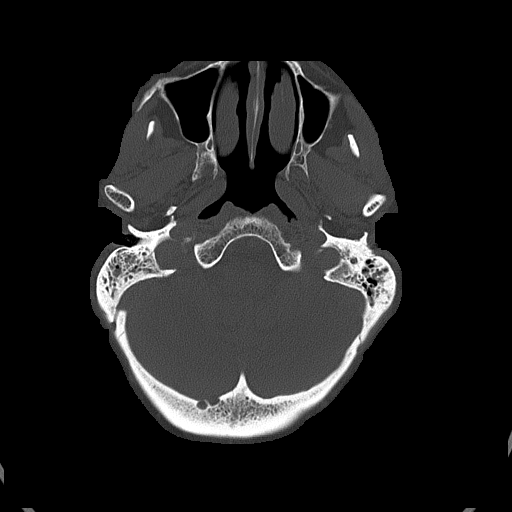
[im 24/80  bone]
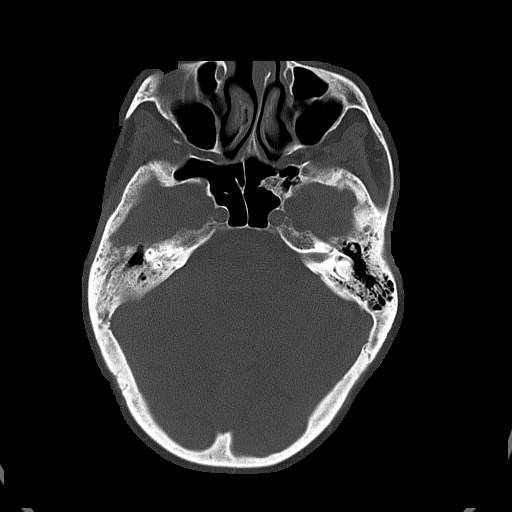
[im 36/80  bone]
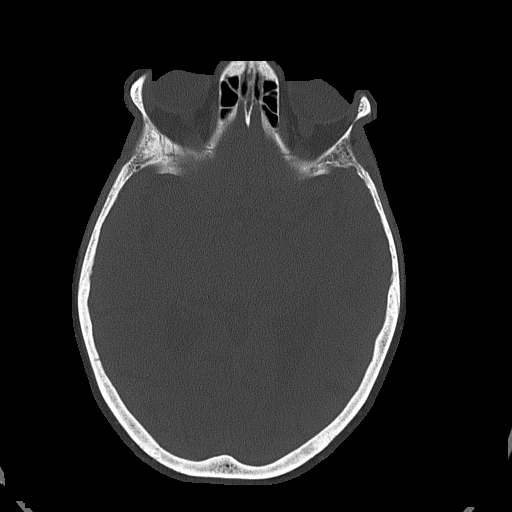
[im 44/80  bone]
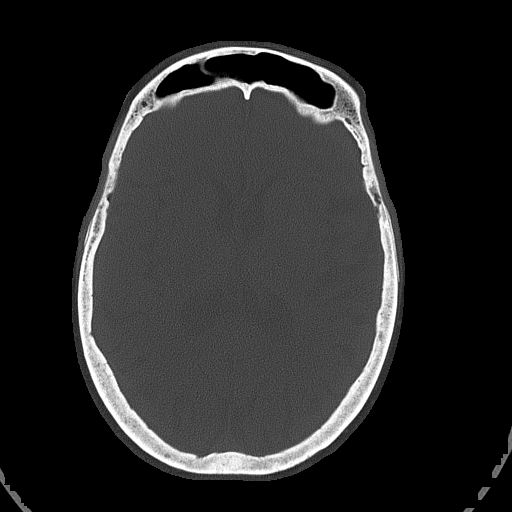
[im 56/80  bone]
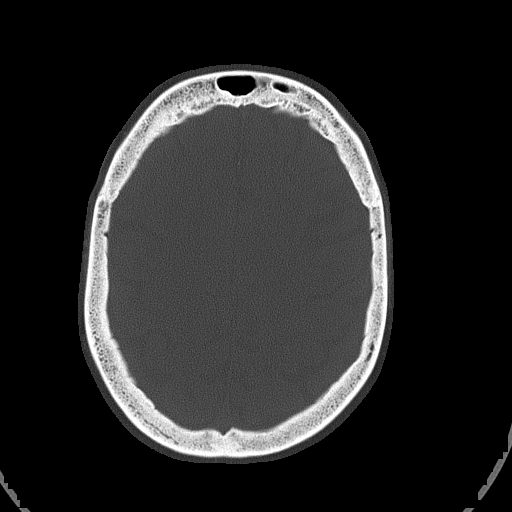

[Series 5: head without cor · coronal · non-contrast · 0.31mm/px · 3 of 74 slices shown]
[im 25/74  brain]
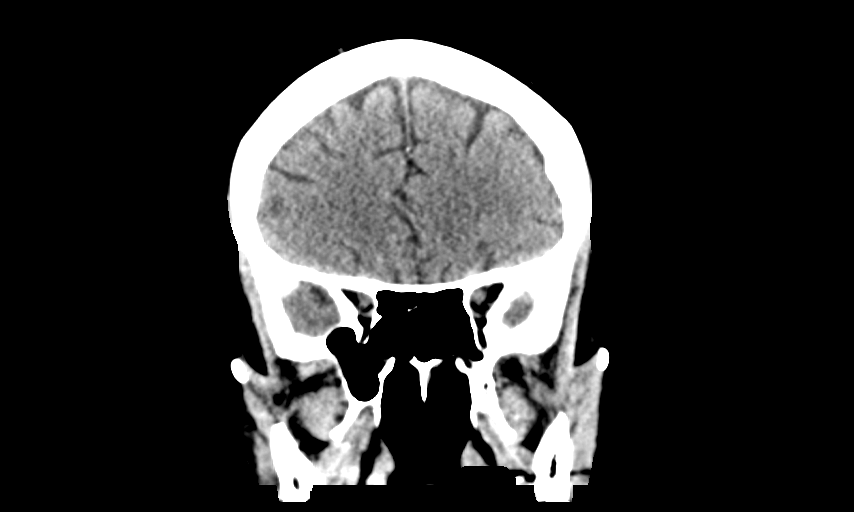
[im 33/74  brain]
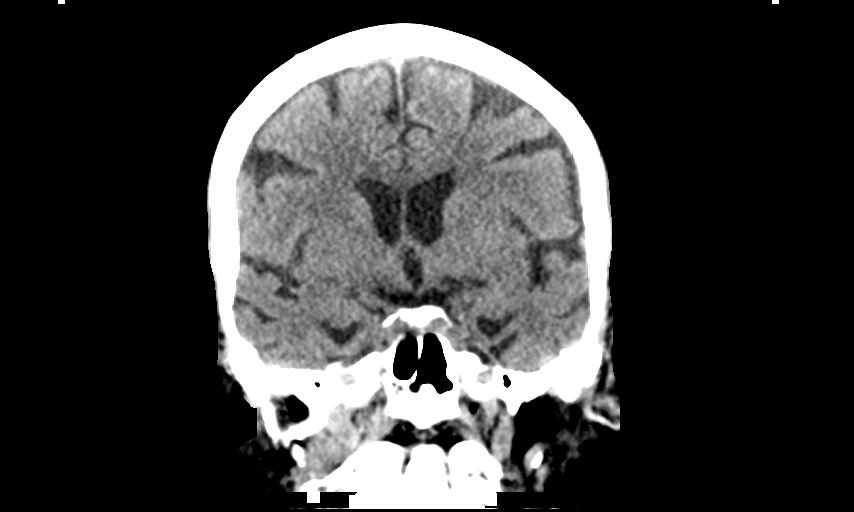
[im 41/74  brain]
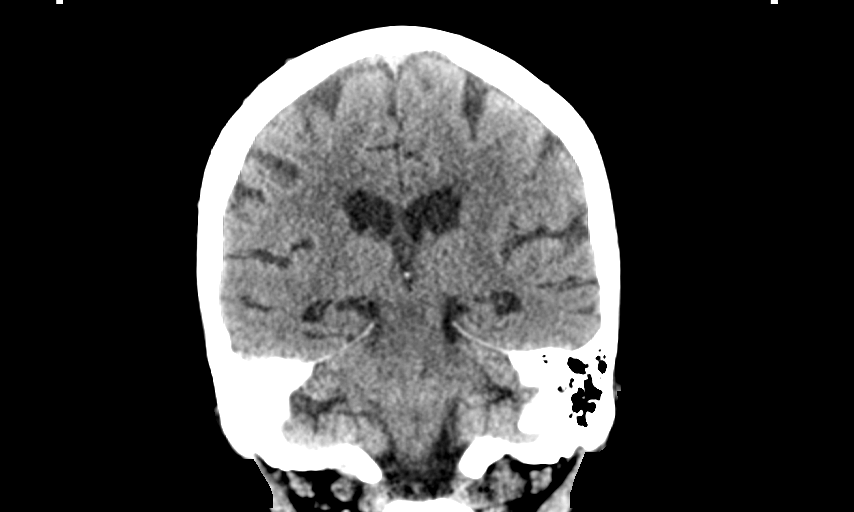

[16 of 40 positions shown; findings below may reference images not displayed]

FINDINGS: Brain: There is mild cerebral atrophy with widening of the
extra-axial spaces and ventricular dilatation.
There are areas of decreased attenuation within the white matter
tracts of the supratentorial brain, consistent with microvascular
disease changes.

Vascular: No hyperdense vessel or unexpected calcification.

Skull: Normal. Negative for fracture or focal lesion.

Sinuses/Orbits: No acute finding.

Other: None.
IMPRESSION: No acute intracranial pathology.

## 2022-03-10 ENCOUNTER — Ambulatory Visit: Payer: Medicare Other | Admitting: Family Medicine

## 2022-04-29 DIAGNOSIS — E119 Type 2 diabetes mellitus without complications: Secondary | ICD-10-CM | POA: Diagnosis not present

## 2022-04-30 DIAGNOSIS — Z79899 Other long term (current) drug therapy: Secondary | ICD-10-CM | POA: Diagnosis not present

## 2022-04-30 DIAGNOSIS — E119 Type 2 diabetes mellitus without complications: Secondary | ICD-10-CM | POA: Diagnosis not present

## 2022-04-30 DIAGNOSIS — E038 Other specified hypothyroidism: Secondary | ICD-10-CM | POA: Diagnosis not present

## 2022-04-30 DIAGNOSIS — E782 Mixed hyperlipidemia: Secondary | ICD-10-CM | POA: Diagnosis not present

## 2022-04-30 DIAGNOSIS — D518 Other vitamin B12 deficiency anemias: Secondary | ICD-10-CM | POA: Diagnosis not present

## 2022-05-01 DIAGNOSIS — N1832 Chronic kidney disease, stage 3b: Secondary | ICD-10-CM | POA: Diagnosis not present

## 2022-05-01 DIAGNOSIS — I10 Essential (primary) hypertension: Secondary | ICD-10-CM | POA: Diagnosis not present

## 2022-05-01 DIAGNOSIS — E119 Type 2 diabetes mellitus without complications: Secondary | ICD-10-CM | POA: Diagnosis not present

## 2022-05-13 DIAGNOSIS — N189 Chronic kidney disease, unspecified: Secondary | ICD-10-CM | POA: Diagnosis not present

## 2022-05-13 DIAGNOSIS — E785 Hyperlipidemia, unspecified: Secondary | ICD-10-CM | POA: Diagnosis not present

## 2022-05-13 DIAGNOSIS — I10 Essential (primary) hypertension: Secondary | ICD-10-CM | POA: Diagnosis not present

## 2022-05-13 DIAGNOSIS — E1165 Type 2 diabetes mellitus with hyperglycemia: Secondary | ICD-10-CM | POA: Diagnosis not present

## 2022-05-13 DIAGNOSIS — G309 Alzheimer's disease, unspecified: Secondary | ICD-10-CM | POA: Diagnosis not present

## 2022-05-13 DIAGNOSIS — F32A Depression, unspecified: Secondary | ICD-10-CM | POA: Diagnosis not present

## 2022-05-13 DIAGNOSIS — E559 Vitamin D deficiency, unspecified: Secondary | ICD-10-CM | POA: Diagnosis not present

## 2022-05-14 DIAGNOSIS — F4323 Adjustment disorder with mixed anxiety and depressed mood: Secondary | ICD-10-CM | POA: Diagnosis not present

## 2022-05-14 DIAGNOSIS — F419 Anxiety disorder, unspecified: Secondary | ICD-10-CM | POA: Diagnosis not present

## 2022-05-14 DIAGNOSIS — F03918 Unspecified dementia, unspecified severity, with other behavioral disturbance: Secondary | ICD-10-CM | POA: Diagnosis not present

## 2022-05-25 DIAGNOSIS — I10 Essential (primary) hypertension: Secondary | ICD-10-CM | POA: Diagnosis not present

## 2022-05-25 DIAGNOSIS — E038 Other specified hypothyroidism: Secondary | ICD-10-CM | POA: Diagnosis not present

## 2022-05-25 DIAGNOSIS — E559 Vitamin D deficiency, unspecified: Secondary | ICD-10-CM | POA: Diagnosis not present

## 2022-05-25 DIAGNOSIS — N189 Chronic kidney disease, unspecified: Secondary | ICD-10-CM | POA: Diagnosis not present

## 2022-05-25 DIAGNOSIS — G309 Alzheimer's disease, unspecified: Secondary | ICD-10-CM | POA: Diagnosis not present

## 2022-05-25 DIAGNOSIS — E785 Hyperlipidemia, unspecified: Secondary | ICD-10-CM | POA: Diagnosis not present

## 2022-05-25 DIAGNOSIS — E119 Type 2 diabetes mellitus without complications: Secondary | ICD-10-CM | POA: Diagnosis not present

## 2022-05-25 DIAGNOSIS — D518 Other vitamin B12 deficiency anemias: Secondary | ICD-10-CM | POA: Diagnosis not present

## 2022-05-26 DIAGNOSIS — Z79899 Other long term (current) drug therapy: Secondary | ICD-10-CM | POA: Diagnosis not present

## 2022-05-26 DIAGNOSIS — D518 Other vitamin B12 deficiency anemias: Secondary | ICD-10-CM | POA: Diagnosis not present

## 2022-05-26 DIAGNOSIS — E782 Mixed hyperlipidemia: Secondary | ICD-10-CM | POA: Diagnosis not present

## 2022-05-26 DIAGNOSIS — E119 Type 2 diabetes mellitus without complications: Secondary | ICD-10-CM | POA: Diagnosis not present

## 2022-05-27 DIAGNOSIS — E1165 Type 2 diabetes mellitus with hyperglycemia: Secondary | ICD-10-CM | POA: Diagnosis not present

## 2022-05-30 DIAGNOSIS — E119 Type 2 diabetes mellitus without complications: Secondary | ICD-10-CM | POA: Diagnosis not present

## 2022-06-02 DIAGNOSIS — E119 Type 2 diabetes mellitus without complications: Secondary | ICD-10-CM | POA: Diagnosis not present

## 2022-06-02 DIAGNOSIS — Z79899 Other long term (current) drug therapy: Secondary | ICD-10-CM | POA: Diagnosis not present

## 2022-06-02 DIAGNOSIS — E782 Mixed hyperlipidemia: Secondary | ICD-10-CM | POA: Diagnosis not present

## 2022-06-02 DIAGNOSIS — D518 Other vitamin B12 deficiency anemias: Secondary | ICD-10-CM | POA: Diagnosis not present

## 2022-06-09 DIAGNOSIS — E119 Type 2 diabetes mellitus without complications: Secondary | ICD-10-CM | POA: Diagnosis not present

## 2022-06-09 DIAGNOSIS — D518 Other vitamin B12 deficiency anemias: Secondary | ICD-10-CM | POA: Diagnosis not present

## 2022-06-09 DIAGNOSIS — E782 Mixed hyperlipidemia: Secondary | ICD-10-CM | POA: Diagnosis not present

## 2022-06-09 DIAGNOSIS — Z79899 Other long term (current) drug therapy: Secondary | ICD-10-CM | POA: Diagnosis not present

## 2022-06-10 DIAGNOSIS — F419 Anxiety disorder, unspecified: Secondary | ICD-10-CM | POA: Diagnosis not present

## 2022-06-10 DIAGNOSIS — E1165 Type 2 diabetes mellitus with hyperglycemia: Secondary | ICD-10-CM | POA: Diagnosis not present

## 2022-06-10 DIAGNOSIS — E785 Hyperlipidemia, unspecified: Secondary | ICD-10-CM | POA: Diagnosis not present

## 2022-06-10 DIAGNOSIS — N189 Chronic kidney disease, unspecified: Secondary | ICD-10-CM | POA: Diagnosis not present

## 2022-06-10 DIAGNOSIS — I10 Essential (primary) hypertension: Secondary | ICD-10-CM | POA: Diagnosis not present

## 2022-06-10 DIAGNOSIS — E559 Vitamin D deficiency, unspecified: Secondary | ICD-10-CM | POA: Diagnosis not present

## 2022-06-11 DIAGNOSIS — F4323 Adjustment disorder with mixed anxiety and depressed mood: Secondary | ICD-10-CM | POA: Diagnosis not present

## 2022-06-11 DIAGNOSIS — F03918 Unspecified dementia, unspecified severity, with other behavioral disturbance: Secondary | ICD-10-CM | POA: Diagnosis not present

## 2022-06-11 DIAGNOSIS — F419 Anxiety disorder, unspecified: Secondary | ICD-10-CM | POA: Diagnosis not present

## 2022-06-20 DIAGNOSIS — E782 Mixed hyperlipidemia: Secondary | ICD-10-CM | POA: Diagnosis not present

## 2022-06-20 DIAGNOSIS — E119 Type 2 diabetes mellitus without complications: Secondary | ICD-10-CM | POA: Diagnosis not present

## 2022-06-20 DIAGNOSIS — N189 Chronic kidney disease, unspecified: Secondary | ICD-10-CM | POA: Diagnosis not present

## 2022-06-20 DIAGNOSIS — G309 Alzheimer's disease, unspecified: Secondary | ICD-10-CM | POA: Diagnosis not present

## 2022-06-20 DIAGNOSIS — D518 Other vitamin B12 deficiency anemias: Secondary | ICD-10-CM | POA: Diagnosis not present

## 2022-06-20 DIAGNOSIS — E559 Vitamin D deficiency, unspecified: Secondary | ICD-10-CM | POA: Diagnosis not present

## 2022-06-20 DIAGNOSIS — I10 Essential (primary) hypertension: Secondary | ICD-10-CM | POA: Diagnosis not present

## 2022-06-20 DIAGNOSIS — E038 Other specified hypothyroidism: Secondary | ICD-10-CM | POA: Diagnosis not present

## 2022-06-26 DIAGNOSIS — E1165 Type 2 diabetes mellitus with hyperglycemia: Secondary | ICD-10-CM | POA: Diagnosis not present

## 2022-07-02 DIAGNOSIS — F03918 Unspecified dementia, unspecified severity, with other behavioral disturbance: Secondary | ICD-10-CM | POA: Diagnosis not present

## 2022-07-02 DIAGNOSIS — F4323 Adjustment disorder with mixed anxiety and depressed mood: Secondary | ICD-10-CM | POA: Diagnosis not present

## 2022-07-02 DIAGNOSIS — F419 Anxiety disorder, unspecified: Secondary | ICD-10-CM | POA: Diagnosis not present

## 2022-07-08 DIAGNOSIS — F418 Other specified anxiety disorders: Secondary | ICD-10-CM | POA: Diagnosis not present

## 2022-07-08 DIAGNOSIS — F32A Depression, unspecified: Secondary | ICD-10-CM | POA: Diagnosis not present

## 2022-07-08 DIAGNOSIS — G309 Alzheimer's disease, unspecified: Secondary | ICD-10-CM | POA: Diagnosis not present

## 2022-07-08 DIAGNOSIS — E1165 Type 2 diabetes mellitus with hyperglycemia: Secondary | ICD-10-CM | POA: Diagnosis not present

## 2022-07-08 DIAGNOSIS — E785 Hyperlipidemia, unspecified: Secondary | ICD-10-CM | POA: Diagnosis not present

## 2022-07-08 DIAGNOSIS — I10 Essential (primary) hypertension: Secondary | ICD-10-CM | POA: Diagnosis not present

## 2022-07-11 DIAGNOSIS — M79671 Pain in right foot: Secondary | ICD-10-CM | POA: Diagnosis not present

## 2022-07-11 DIAGNOSIS — L600 Ingrowing nail: Secondary | ICD-10-CM | POA: Diagnosis not present

## 2022-07-11 DIAGNOSIS — E1140 Type 2 diabetes mellitus with diabetic neuropathy, unspecified: Secondary | ICD-10-CM | POA: Diagnosis not present

## 2022-07-11 DIAGNOSIS — M79672 Pain in left foot: Secondary | ICD-10-CM | POA: Diagnosis not present

## 2022-07-11 DIAGNOSIS — B351 Tinea unguium: Secondary | ICD-10-CM | POA: Diagnosis not present

## 2022-07-16 DIAGNOSIS — N189 Chronic kidney disease, unspecified: Secondary | ICD-10-CM | POA: Diagnosis not present

## 2022-07-16 DIAGNOSIS — I10 Essential (primary) hypertension: Secondary | ICD-10-CM | POA: Diagnosis not present

## 2022-07-16 DIAGNOSIS — D518 Other vitamin B12 deficiency anemias: Secondary | ICD-10-CM | POA: Diagnosis not present

## 2022-07-16 DIAGNOSIS — E119 Type 2 diabetes mellitus without complications: Secondary | ICD-10-CM | POA: Diagnosis not present

## 2022-07-16 DIAGNOSIS — E785 Hyperlipidemia, unspecified: Secondary | ICD-10-CM | POA: Diagnosis not present

## 2022-07-16 DIAGNOSIS — F419 Anxiety disorder, unspecified: Secondary | ICD-10-CM | POA: Diagnosis not present

## 2022-07-16 DIAGNOSIS — G309 Alzheimer's disease, unspecified: Secondary | ICD-10-CM | POA: Diagnosis not present

## 2022-07-16 DIAGNOSIS — E038 Other specified hypothyroidism: Secondary | ICD-10-CM | POA: Diagnosis not present

## 2022-07-16 DIAGNOSIS — E559 Vitamin D deficiency, unspecified: Secondary | ICD-10-CM | POA: Diagnosis not present

## 2022-07-29 DIAGNOSIS — E119 Type 2 diabetes mellitus without complications: Secondary | ICD-10-CM | POA: Diagnosis not present

## 2022-07-30 DIAGNOSIS — F03918 Unspecified dementia, unspecified severity, with other behavioral disturbance: Secondary | ICD-10-CM | POA: Diagnosis not present

## 2022-08-04 DIAGNOSIS — I10 Essential (primary) hypertension: Secondary | ICD-10-CM | POA: Diagnosis not present

## 2022-08-04 DIAGNOSIS — E119 Type 2 diabetes mellitus without complications: Secondary | ICD-10-CM | POA: Diagnosis not present

## 2022-08-04 DIAGNOSIS — E038 Other specified hypothyroidism: Secondary | ICD-10-CM | POA: Diagnosis not present

## 2022-08-04 DIAGNOSIS — N189 Chronic kidney disease, unspecified: Secondary | ICD-10-CM | POA: Diagnosis not present

## 2022-08-04 DIAGNOSIS — E785 Hyperlipidemia, unspecified: Secondary | ICD-10-CM | POA: Diagnosis not present

## 2022-08-04 DIAGNOSIS — D518 Other vitamin B12 deficiency anemias: Secondary | ICD-10-CM | POA: Diagnosis not present

## 2022-08-04 DIAGNOSIS — F419 Anxiety disorder, unspecified: Secondary | ICD-10-CM | POA: Diagnosis not present

## 2022-08-04 DIAGNOSIS — G309 Alzheimer's disease, unspecified: Secondary | ICD-10-CM | POA: Diagnosis not present

## 2022-08-04 DIAGNOSIS — E559 Vitamin D deficiency, unspecified: Secondary | ICD-10-CM | POA: Diagnosis not present

## 2022-08-05 DIAGNOSIS — E559 Vitamin D deficiency, unspecified: Secondary | ICD-10-CM | POA: Diagnosis not present

## 2022-08-05 DIAGNOSIS — F419 Anxiety disorder, unspecified: Secondary | ICD-10-CM | POA: Diagnosis not present

## 2022-08-05 DIAGNOSIS — E1165 Type 2 diabetes mellitus with hyperglycemia: Secondary | ICD-10-CM | POA: Diagnosis not present

## 2022-08-05 DIAGNOSIS — I10 Essential (primary) hypertension: Secondary | ICD-10-CM | POA: Diagnosis not present

## 2022-08-05 DIAGNOSIS — N189 Chronic kidney disease, unspecified: Secondary | ICD-10-CM | POA: Diagnosis not present

## 2022-08-05 DIAGNOSIS — E785 Hyperlipidemia, unspecified: Secondary | ICD-10-CM | POA: Diagnosis not present

## 2022-08-05 DIAGNOSIS — F32A Depression, unspecified: Secondary | ICD-10-CM | POA: Diagnosis not present

## 2022-08-18 DIAGNOSIS — Z79899 Other long term (current) drug therapy: Secondary | ICD-10-CM | POA: Diagnosis not present

## 2022-08-18 DIAGNOSIS — D518 Other vitamin B12 deficiency anemias: Secondary | ICD-10-CM | POA: Diagnosis not present

## 2022-08-18 DIAGNOSIS — E038 Other specified hypothyroidism: Secondary | ICD-10-CM | POA: Diagnosis not present

## 2022-08-18 DIAGNOSIS — E119 Type 2 diabetes mellitus without complications: Secondary | ICD-10-CM | POA: Diagnosis not present

## 2022-08-18 DIAGNOSIS — E782 Mixed hyperlipidemia: Secondary | ICD-10-CM | POA: Diagnosis not present

## 2022-08-20 DIAGNOSIS — F03B3 Unspecified dementia, moderate, with mood disturbance: Secondary | ICD-10-CM | POA: Diagnosis not present

## 2022-08-20 DIAGNOSIS — F419 Anxiety disorder, unspecified: Secondary | ICD-10-CM | POA: Diagnosis not present

## 2022-08-20 DIAGNOSIS — F02B3 Dementia in other diseases classified elsewhere, moderate, with mood disturbance: Secondary | ICD-10-CM | POA: Diagnosis not present

## 2022-08-25 DIAGNOSIS — E1165 Type 2 diabetes mellitus with hyperglycemia: Secondary | ICD-10-CM | POA: Diagnosis not present

## 2022-08-28 DIAGNOSIS — E119 Type 2 diabetes mellitus without complications: Secondary | ICD-10-CM | POA: Diagnosis not present

## 2022-09-03 ENCOUNTER — Encounter: Payer: Self-pay | Admitting: Family Medicine

## 2022-09-03 ENCOUNTER — Ambulatory Visit: Payer: Medicare HMO | Admitting: Family Medicine

## 2022-09-03 VITALS — BP 131/64 | HR 67 | Ht 67.0 in | Wt 127.5 lb

## 2022-09-03 DIAGNOSIS — G301 Alzheimer's disease with late onset: Secondary | ICD-10-CM

## 2022-09-03 DIAGNOSIS — F0280 Dementia in other diseases classified elsewhere without behavioral disturbance: Secondary | ICD-10-CM | POA: Diagnosis not present

## 2022-09-03 NOTE — Patient Instructions (Signed)
Below is our plan:  We will continue donepezil 10mg  daily at bedtime. I recommend increasing memantine to 10mg  twice daily. Monitor for any concerns of GI distress (diarrhea, upset stomach, stomach pain).   Please make sure you are staying well hydrated. I recommend 50-60 ounces daily. Well balanced diet and regular exercise encouraged. Consistent sleep schedule with 6-8 hours recommended.   Please continue follow up with care team as directed.   Follow up with me in 1 year , sooner if needed   You may receive a survey regarding today's visit. I encourage you to leave honest feed back as I do use this information to improve patient care. Thank you for seeing me today!   Management of Memory Problems   There are some general things you can do to help manage your memory problems.  Your memory may not in fact recover, but by using techniques and strategies you will be able to manage your memory difficulties better.   1)  Establish a routine. Try to establish and then stick to a regular routine.  By doing this, you will get used to what to expect and you will reduce the need to rely on your memory.  Also, try to do things at the same time of day, such as taking your medication or checking your calendar first thing in the morning. Think about think that you can do as a part of a regular routine and make a list.  Then enter them into a daily planner to remind you.  This will help you establish a routine.   2)  Organize your environment. Organize your environment so that it is uncluttered.  Decrease visual stimulation.  Place everyday items such as keys or cell phone in the same place every day (ie.  Basket next to front door) Use post it notes with a brief message to yourself (ie. Turn off light, lock the door) Use labels to indicate where things go (ie. Which cupboards are for food, dishes, etc.) Keep a notepad and pen by the telephone to take messages   3)  Memory Aids A diary or  journal/notebook/daily planner Making a list (shopping list, chore list, to do list that needs to be done) Using an alarm as a reminder (kitchen timer or cell phone alarm) Using cell phone to store information (Notes, Calendar, Reminders) Calendar/White board placed in a prominent position Post-it notes   In order for memory aids to be useful, you need to have good habits.  It's no good remembering to make a note in your journal if you don't remember to look in it.  Try setting aside a certain time of day to look in journal.   4)  Improving mood and managing fatigue. There may be other factors that contribute to memory difficulties.  Factors, such as anxiety, depression and tiredness can affect memory. Regular gentle exercise can help improve your mood and give you more energy. Exercise: there are short videos created by the [employer] on Health specially for older adults: https://bit.ly/2I30q97.  Mediterranean diet: which emphasizes fruits, vegetables, whole grains, legumes, fish, and other seafood; unsaturated fats such as olive oils; and low amounts of red meat, eggs, and sweets. A variation of this, called MIND (Mediterranean-DASH Intervention for Neurodegenerative Delay) incorporates the DASH (Dietary Approaches to Stop Hypertension) diet, which has been shown to lower high blood pressure, a risk factor for Alzheimer's disease. More information at: ExitMarketing.de.  Aerobic exercise that improve heart health is also good for the  mind.  [employer] on Aging have short videos for exercises that you can do at home: BlindWorkshop.com.pt Simple relaxation techniques may help relieve symptoms of anxiety Try to get back to completing activities or hobbies you enjoyed doing in the past. Learn to pace yourself through activities to decrease fatigue. Find out about some local support groups where you can share  experiences with others. Try and achieve 7-8 hours of sleep at night.   Resources for Family/Caregiver  Online caregiver support groups can be found at WesternTunes.it or call Alzheimer's Association's 24/7 hotline: (534)242-8871. [facility] Counsel[facility] Program offers in-person, virtual support groups and individual counseling for both care partners and persons with memory loss. Call for more information at [phone].   Advanced care plan: there are two types of Power of Attorney: healthcare and durable. Healthcare POA is a designated person to make healthcare decisions on your behalf if you were too sick to make them yourself. This person can be selected and documented by your physician. Durable POA has to be set up with a lawyer who takes charge of your finances and estate if you were too sick or cognitively impaired to manage your finances accurately. You can find a local Elder Therapist, art here: [place]Ranch.at.  Check out www.planyourlifespan.org, which will help you plan before a crisis and decide who will take care of life considerations in a circumstance where you may not be able to speak for yourself.   Helpful books (available on [employer] or your local bookstore):  By Dr. Carl Best: Keeping Love Alive as Memories Fade: The 5 Love Languages and the Alzheimer's Journey Jan 27, 2015 The Dementia Care Partner's Workbook: A Guide for Understanding, Education, and [employer] - September 26, 2017.  Both available for less than $15.   "Coping with behavior change in dementia: a family caregiver's guide" by Ricardo Jericho & Valora Piccolo "A Caregiver's Guide to Dementia: Using Activities and Other Strategies to Prevent, Reduce and Manage Behavioral Symptoms" by Mahala Menghini Gitlin and [employer].  Youth worker of Joy for the Person with Alzheimer's or Dementia" 4th edition by Tama High  Caregiver videos on common behaviors related to dementia:  PopulationGame.pl  [place] Caregiver Portal: free to sign up, links to local resources: [email or web address]
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
## 2022-09-10 DIAGNOSIS — F4323 Adjustment disorder with mixed anxiety and depressed mood: Secondary | ICD-10-CM | POA: Diagnosis not present

## 2022-09-10 DIAGNOSIS — F03918 Unspecified dementia, unspecified severity, with other behavioral disturbance: Secondary | ICD-10-CM | POA: Diagnosis not present

## 2022-09-10 DIAGNOSIS — F419 Anxiety disorder, unspecified: Secondary | ICD-10-CM | POA: Diagnosis not present

## 2022-09-10 DIAGNOSIS — F03B3 Unspecified dementia, moderate, with mood disturbance: Secondary | ICD-10-CM | POA: Diagnosis not present

## 2022-09-18 DIAGNOSIS — E038 Other specified hypothyroidism: Secondary | ICD-10-CM | POA: Diagnosis not present

## 2022-09-18 DIAGNOSIS — E119 Type 2 diabetes mellitus without complications: Secondary | ICD-10-CM | POA: Diagnosis not present

## 2022-09-18 DIAGNOSIS — D518 Other vitamin B12 deficiency anemias: Secondary | ICD-10-CM | POA: Diagnosis not present

## 2022-09-18 DIAGNOSIS — F419 Anxiety disorder, unspecified: Secondary | ICD-10-CM | POA: Diagnosis not present

## 2022-09-18 DIAGNOSIS — I10 Essential (primary) hypertension: Secondary | ICD-10-CM | POA: Diagnosis not present

## 2022-09-18 DIAGNOSIS — E7849 Other hyperlipidemia: Secondary | ICD-10-CM | POA: Diagnosis not present

## 2022-09-18 DIAGNOSIS — N189 Chronic kidney disease, unspecified: Secondary | ICD-10-CM | POA: Diagnosis not present

## 2022-09-18 DIAGNOSIS — I70223 Atherosclerosis of native arteries of extremities with rest pain, bilateral legs: Secondary | ICD-10-CM | POA: Diagnosis not present

## 2022-09-18 DIAGNOSIS — E559 Vitamin D deficiency, unspecified: Secondary | ICD-10-CM | POA: Diagnosis not present

## 2022-09-24 DIAGNOSIS — E1165 Type 2 diabetes mellitus with hyperglycemia: Secondary | ICD-10-CM | POA: Diagnosis not present

## 2022-09-28 DIAGNOSIS — E119 Type 2 diabetes mellitus without complications: Secondary | ICD-10-CM | POA: Diagnosis not present

## 2022-10-02 DIAGNOSIS — I10 Essential (primary) hypertension: Secondary | ICD-10-CM | POA: Diagnosis not present

## 2022-10-02 DIAGNOSIS — N1832 Chronic kidney disease, stage 3b: Secondary | ICD-10-CM | POA: Diagnosis not present

## 2022-10-07 DIAGNOSIS — E785 Hyperlipidemia, unspecified: Secondary | ICD-10-CM | POA: Diagnosis not present

## 2022-10-07 DIAGNOSIS — I10 Essential (primary) hypertension: Secondary | ICD-10-CM | POA: Diagnosis not present

## 2022-10-07 DIAGNOSIS — N189 Chronic kidney disease, unspecified: Secondary | ICD-10-CM | POA: Diagnosis not present

## 2022-10-07 DIAGNOSIS — E1165 Type 2 diabetes mellitus with hyperglycemia: Secondary | ICD-10-CM | POA: Diagnosis not present

## 2022-10-07 DIAGNOSIS — F32A Depression, unspecified: Secondary | ICD-10-CM | POA: Diagnosis not present

## 2022-10-07 DIAGNOSIS — E559 Vitamin D deficiency, unspecified: Secondary | ICD-10-CM | POA: Diagnosis not present

## 2022-10-07 DIAGNOSIS — G309 Alzheimer's disease, unspecified: Secondary | ICD-10-CM | POA: Diagnosis not present

## 2022-10-17 DIAGNOSIS — N189 Chronic kidney disease, unspecified: Secondary | ICD-10-CM | POA: Diagnosis not present

## 2022-10-17 DIAGNOSIS — E119 Type 2 diabetes mellitus without complications: Secondary | ICD-10-CM | POA: Diagnosis not present

## 2022-10-17 DIAGNOSIS — G309 Alzheimer's disease, unspecified: Secondary | ICD-10-CM | POA: Diagnosis not present

## 2022-10-17 DIAGNOSIS — I10 Essential (primary) hypertension: Secondary | ICD-10-CM | POA: Diagnosis not present

## 2022-10-17 DIAGNOSIS — E038 Other specified hypothyroidism: Secondary | ICD-10-CM | POA: Diagnosis not present

## 2022-10-17 DIAGNOSIS — F419 Anxiety disorder, unspecified: Secondary | ICD-10-CM | POA: Diagnosis not present

## 2022-10-17 DIAGNOSIS — D518 Other vitamin B12 deficiency anemias: Secondary | ICD-10-CM | POA: Diagnosis not present

## 2022-10-17 DIAGNOSIS — E559 Vitamin D deficiency, unspecified: Secondary | ICD-10-CM | POA: Diagnosis not present

## 2022-10-17 DIAGNOSIS — E785 Hyperlipidemia, unspecified: Secondary | ICD-10-CM | POA: Diagnosis not present

## 2022-10-20 DIAGNOSIS — F03B3 Unspecified dementia, moderate, with mood disturbance: Secondary | ICD-10-CM | POA: Diagnosis not present

## 2022-10-20 DIAGNOSIS — F4323 Adjustment disorder with mixed anxiety and depressed mood: Secondary | ICD-10-CM | POA: Diagnosis not present

## 2022-10-20 DIAGNOSIS — F419 Anxiety disorder, unspecified: Secondary | ICD-10-CM | POA: Diagnosis not present

## 2022-10-20 DIAGNOSIS — F03918 Unspecified dementia, unspecified severity, with other behavioral disturbance: Secondary | ICD-10-CM | POA: Diagnosis not present

## 2022-10-22 DIAGNOSIS — Z961 Presence of intraocular lens: Secondary | ICD-10-CM | POA: Diagnosis not present

## 2022-10-22 DIAGNOSIS — E119 Type 2 diabetes mellitus without complications: Secondary | ICD-10-CM | POA: Diagnosis not present

## 2022-10-25 DIAGNOSIS — E1165 Type 2 diabetes mellitus with hyperglycemia: Secondary | ICD-10-CM | POA: Diagnosis not present
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
## 2023-09-01 NOTE — Patient Instructions (Incomplete)
Below is our plan:  We will continue donepezil 10mg and memantine 10mg daily  Please make sure you are staying well hydrated. I recommend 50-60 ounces daily. Well balanced diet and regular exercise encouraged. Consistent sleep schedule with 6-8 hours recommended.   Please continue follow up with care team as directed.   Follow up with me in 1 year   You may receive a survey regarding today's visit. I encourage you to leave honest feed back as I do use this information to improve patient care. Thank you for seeing me today!   Management of Memory Problems   There are some general things you can do to help manage your memory problems.  Your memory may not in fact recover, but by using techniques and strategies you will be able to manage your memory difficulties better.   1)  Establish a routine. Try to establish and then stick to a regular routine.  By doing this, you will get used to what to expect and you will reduce the need to rely on your memory.  Also, try to do things at the same time of day, such as taking your medication or checking your calendar first thing in the morning. Think about think that you can do as a part of a regular routine and make a list.  Then enter them into a daily planner to remind you.  This will help you establish a routine.   2)  Organize your environment. Organize your environment so that it is uncluttered.  Decrease visual stimulation.  Place everyday items such as keys or cell phone in the same place every day (ie.  Basket next to front door) Use post it notes with a brief message to yourself (ie. Turn off light, lock the door) Use labels to indicate where things go (ie. Which cupboards are for food, dishes, etc.) Keep a notepad and pen by the telephone to take messages   3)  Memory Aids A diary or journal/notebook/daily planner Making a list (shopping list, chore list, to do list that needs to be done) Using an alarm as a reminder (kitchen timer or cell  phone alarm) Using cell phone to store information (Notes, Calendar, Reminders) Calendar/White board placed in a prominent position Post-it notes   In order for memory aids to be useful, you need to have good habits.  It's no good remembering to make a note in your journal if you don't remember to look in it.  Try setting aside a certain time of day to look in journal.   4)  Improving mood and managing fatigue. There may be other factors that contribute to memory difficulties.  Factors, such as anxiety, depression and tiredness can affect memory. Regular gentle exercise can help improve your mood and give you more energy. Exercise: there are short videos created by the National Institute on Health specially for older adults: https://bit.ly/2I30q97.  Mediterranean diet: which emphasizes fruits, vegetables, whole grains, legumes, fish, and other seafood; unsaturated fats such as olive oils; and low amounts of red meat, eggs, and sweets. A variation of this, called MIND (Mediterranean-DASH Intervention for Neurodegenerative Delay) incorporates the DASH (Dietary Approaches to Stop Hypertension) diet, which has been shown to lower high blood pressure, a risk factor for Alzheimer's disease. More information at: https://www.nia.nih.gov/health/what-do-we-know-about-diet-and-prevention-alzheimers-disease.  Aerobic exercise that improve heart health is also good for the mind.  National Institute on Aging have short videos for exercises that you can do at home: www.nia.nih.gov/Go4Life Simple relaxation techniques may help relieve   symptoms of anxiety Try to get back to completing activities or hobbies you enjoyed doing in the past. Learn to pace yourself through activities to decrease fatigue. Find out about some local support groups where you can share experiences with others. Try and achieve 7-8 hours of sleep at night.   Resources for Family/Caregiver  Online caregiver support groups can be found at  alz.org or call Alzheimer's Association's 24/7 hotline: 800.272.3900. [facility] offers in-person, virtual support groups and individual counseling for both care partners and persons with memory loss. Call for more information at [phone].   Advanced care plan: there are two types of Power of Attorney: healthcare and durable. Healthcare POA is a designated person to make healthcare decisions on your behalf if you were too sick to make them yourself. This person can be selected and documented by your physician. Durable POA has to be set up with a lawyer who takes charge of your finances and estate if you were too sick or cognitively impaired to manage your finances accurately. You can find a local Elder Law lawyer here: https://www.naela.org/.  Check out www.planyourlifespan.org, which will help you plan before a crisis and decide who will take care of life considerations in a circumstance where you may not be able to speak for yourself.   Helpful books (available on Amazon or your local bookstore):  By Dr. Ed Shaw: Keeping Love Alive as Memories Fade: The 5 Love Languages and the Alzheimer's Journey Jan 27, 2015 The Dementia Care Partner's Workbook: A Guide for Understanding, Education, and Hope Paperback - September 26, 2017.  Both available for less than $15.   "Coping with behavior change in dementia: a family caregiver's guide" by Beth Spencer & Laurie White "A Caregiver's Guide to Dementia: Using Activities and Other Strategies to Prevent, Reduce and Manage Behavioral Symptoms" by Laura N. Gitlin and Catherine Piersol.  "Creating Moments of Joy for the Person with Alzheimer's or Dementia" 4th edition by Jolene Brackrey  Caregiver videos on common behaviors related to dementia: https://www.uclahealth.org/dementia/caregiver-education-videos  [facility] Caregiver Portal: free to sign up, links to local resources: [email or web address]-caregivers.com/login  

## 2023-09-03 ENCOUNTER — Encounter: Payer: Self-pay | Admitting: Family Medicine

## 2023-09-03 ENCOUNTER — Ambulatory Visit (INDEPENDENT_AMBULATORY_CARE_PROVIDER_SITE_OTHER): Payer: Medicare HMO | Admitting: Family Medicine

## 2023-09-03 VITALS — BP 130/61 | HR 60 | Ht 67.0 in | Wt 120.8 lb

## 2023-09-03 DIAGNOSIS — F0280 Dementia in other diseases classified elsewhere without behavioral disturbance: Secondary | ICD-10-CM | POA: Diagnosis not present

## 2023-09-03 DIAGNOSIS — G301 Alzheimer's disease with late onset: Secondary | ICD-10-CM

## 2023-09-07 MED ORDER — MEMANTINE HCL 10 MG PO TABS: 10.0000 mg | ORAL_TABLET | Freq: Two times a day (BID) | ORAL | 3 refills | Status: AC

## 2023-09-07 NOTE — Addendum Note (Signed)
 Addended by: Terrilyn Fick L on: 09/07/2023 03:53 PM   Modules accepted: Orders

## 2023-09-08 ENCOUNTER — Telehealth: Payer: Self-pay | Admitting: *Deleted

## 2023-09-08 NOTE — Telephone Encounter (Signed)
-----   Message from Amy Lomax sent at 09/07/2023  3:53 PM EDT ----- New script printed. I think facility prescribes to her pharmacy but I am happy to send to pharmacy if needed. TY ----- Message ----- From: Fredi January, RMA Sent: 09/03/2023   4:25 PM EDT To: Terrilyn Fick, NP  I called [facility] and spoke with Tammy patient is currently taking mematine 5 mg 1 po bid.   Tammy said I can fax the Rx if you want to increase to her at [phone] Attention:Tammy. ----- Message ----- From: Terrilyn Fick, NP Sent: 09/03/2023   3:54 PM EDT To: [facility] Calls  Can you guys please try to contact [facility] to get a current dose for memantine . I recommended increasing to 10mg  BID at last visit 08/2022 but patient and niece unsure what dose is being given. Rx comes through facility. Contact info inculded in note. TY!

## 2023-09-08 NOTE — Telephone Encounter (Signed)
 Order faxed to Attention:Tammy at [phone] confirmation received.

## 2024-03-03 ENCOUNTER — Telehealth: Payer: Self-pay | Admitting: Family Medicine

## 2024-03-03 ENCOUNTER — Encounter: Payer: Self-pay | Admitting: Family Medicine

## 2024-03-03 NOTE — Telephone Encounter (Signed)
 LVM and sent letter in mail informing pt of need to reschedule 09/08/24 appt - NP schedule change

## 2024-04-14 ENCOUNTER — Other Ambulatory Visit: Payer: Self-pay

## 2024-04-14 ENCOUNTER — Emergency Department

## 2024-04-14 ENCOUNTER — Inpatient Hospital Stay
Admission: EM | Admit: 2024-04-14 | Discharge: 2024-04-17 | DRG: 638 | Disposition: A | Discharge status: Nursing Home (Includes ICF) | Source: Skilled Nursing Facility | Attending: Hospitalist | Admitting: Hospitalist

## 2024-04-14 DIAGNOSIS — N179 Acute kidney failure, unspecified: Secondary | ICD-10-CM | POA: Diagnosis not present

## 2024-04-14 DIAGNOSIS — R509 Fever, unspecified: Secondary | ICD-10-CM | POA: Diagnosis present

## 2024-04-14 DIAGNOSIS — E1122 Type 2 diabetes mellitus with diabetic chronic kidney disease: Secondary | ICD-10-CM | POA: Diagnosis present

## 2024-04-14 DIAGNOSIS — I10 Essential (primary) hypertension: Secondary | ICD-10-CM

## 2024-04-14 DIAGNOSIS — G301 Alzheimer's disease with late onset: Secondary | ICD-10-CM | POA: Diagnosis not present

## 2024-04-14 DIAGNOSIS — M8580 Other specified disorders of bone density and structure, unspecified site: Secondary | ICD-10-CM | POA: Diagnosis not present

## 2024-04-14 DIAGNOSIS — R42 Dizziness and giddiness: Secondary | ICD-10-CM | POA: Diagnosis present

## 2024-04-14 DIAGNOSIS — K219 Gastro-esophageal reflux disease without esophagitis: Secondary | ICD-10-CM | POA: Diagnosis not present

## 2024-04-14 DIAGNOSIS — Z79899 Other long term (current) drug therapy: Secondary | ICD-10-CM

## 2024-04-14 DIAGNOSIS — Z794 Long term (current) use of insulin: Secondary | ICD-10-CM

## 2024-04-14 DIAGNOSIS — R197 Diarrhea, unspecified: Secondary | ICD-10-CM | POA: Diagnosis not present

## 2024-04-14 DIAGNOSIS — Z90710 Acquired absence of both cervix and uterus: Secondary | ICD-10-CM | POA: Diagnosis not present

## 2024-04-14 DIAGNOSIS — E1110 Type 2 diabetes mellitus with ketoacidosis without coma: Secondary | ICD-10-CM | POA: Diagnosis not present

## 2024-04-14 DIAGNOSIS — D72829 Elevated white blood cell count, unspecified: Secondary | ICD-10-CM | POA: Diagnosis not present

## 2024-04-14 DIAGNOSIS — Z881 Allergy status to other antibiotic agents status: Secondary | ICD-10-CM | POA: Diagnosis not present

## 2024-04-14 DIAGNOSIS — E785 Hyperlipidemia, unspecified: Secondary | ICD-10-CM | POA: Diagnosis present

## 2024-04-14 DIAGNOSIS — I4510 Unspecified right bundle-branch block: Secondary | ICD-10-CM | POA: Diagnosis not present

## 2024-04-14 DIAGNOSIS — I129 Hypertensive chronic kidney disease with stage 1 through stage 4 chronic kidney disease, or unspecified chronic kidney disease: Secondary | ICD-10-CM | POA: Diagnosis not present

## 2024-04-14 DIAGNOSIS — F0280 Dementia in other diseases classified elsewhere without behavioral disturbance: Secondary | ICD-10-CM | POA: Diagnosis not present

## 2024-04-14 DIAGNOSIS — I959 Hypotension, unspecified: Secondary | ICD-10-CM | POA: Diagnosis not present

## 2024-04-14 DIAGNOSIS — N1832 Chronic kidney disease, stage 3b: Secondary | ICD-10-CM | POA: Diagnosis not present

## 2024-04-14 DIAGNOSIS — E1310 Other specified diabetes mellitus with ketoacidosis without coma: Principal | ICD-10-CM

## 2024-04-14 DIAGNOSIS — E860 Dehydration: Secondary | ICD-10-CM | POA: Diagnosis present

## 2024-04-14 DIAGNOSIS — R55 Syncope and collapse: Secondary | ICD-10-CM | POA: Diagnosis present

## 2024-04-14 DIAGNOSIS — E872 Acidosis, unspecified: Secondary | ICD-10-CM | POA: Insufficient documentation

## 2024-04-14 DIAGNOSIS — Z7984 Long term (current) use of oral hypoglycemic drugs: Secondary | ICD-10-CM | POA: Diagnosis not present

## 2024-04-14 DIAGNOSIS — Z885 Allergy status to narcotic agent status: Secondary | ICD-10-CM

## 2024-04-14 LAB — TROPONIN T, HIGH SENSITIVITY: Troponin T High Sensitivity: <15 ng/L (ref 0–19)

## 2024-04-14 LAB — BLOOD GAS, VENOUS
Acid-base deficit: 5.6 mmol/L — ABNORMAL HIGH (ref 0.0–2.0)
Bicarbonate: 21.1 mmol/L (ref 20.0–28.0)
O2 Saturation: 87.1 %
Patient temperature: 37
pCO2, Ven: 45 mmHg (ref 44–60)
pH, Ven: 7.28 (ref 7.25–7.43)
pO2, Ven: 53 mmHg — ABNORMAL HIGH (ref 32–45)

## 2024-04-14 LAB — COMPREHENSIVE METABOLIC PANEL WITH GFR
ALT: 25 U/L (ref 0–44)
AST: 24 U/L (ref 15–41)
Albumin: 4.7 g/dL (ref 3.5–5.0)
Alkaline Phosphatase: 84 U/L (ref 38–126)
Anion gap: 17 — ABNORMAL HIGH (ref 5–15)
BUN: 29 mg/dL — ABNORMAL HIGH (ref 8–23)
CO2: 23 mmol/L (ref 22–32)
Calcium: 9.6 mg/dL (ref 8.9–10.3)
Chloride: 99 mmol/L (ref 98–111)
Creatinine, Ser: 1.98 mg/dL — ABNORMAL HIGH (ref 0.44–1.00)
GFR, Estimated: 24 mL/min — ABNORMAL LOW (ref >60)
Glucose, Bld: 278 mg/dL — ABNORMAL HIGH (ref 70–99)
Potassium: 4.6 mmol/L (ref 3.5–5.1)
Sodium: 139 mmol/L (ref 135–145)
Total Bilirubin: 0.5 mg/dL (ref 0.0–1.2)
Total Protein: 8.3 g/dL — ABNORMAL HIGH (ref 6.5–8.1)

## 2024-04-14 LAB — CBC
HCT: 43.3 % (ref 36.0–46.0)
Hemoglobin: 14.7 g/dL (ref 12.0–15.0)
MCH: 29.9 pg (ref 26.0–34.0)
MCHC: 33.9 g/dL (ref 30.0–36.0)
MCV: 88 fL (ref 80.0–100.0)
Platelets: 194 K/uL (ref 150–400)
RBC: 4.92 MIL/uL (ref 3.87–5.11)
RDW: 12.1 % (ref 11.5–15.5)
WBC: 27.4 K/uL — ABNORMAL HIGH (ref 4.0–10.5)
nRBC: 0 % (ref 0.0–0.2)

## 2024-04-14 LAB — CBC WITH DIFFERENTIAL/PLATELET
Abs Immature Granulocytes: 0.09 K/uL — ABNORMAL HIGH (ref 0.00–0.07)
Basophils Absolute: 0.1 K/uL (ref 0.0–0.1)
Basophils Relative: 0 %
Eosinophils Absolute: 0.8 K/uL — ABNORMAL HIGH (ref 0.0–0.5)
Eosinophils Relative: 4 %
HCT: 39.3 % (ref 36.0–46.0)
Hemoglobin: 13.2 g/dL (ref 12.0–15.0)
Immature Granulocytes: 1 %
Lymphocytes Relative: 5 %
Lymphs Abs: 0.9 K/uL (ref 0.7–4.0)
MCH: 29.9 pg (ref 26.0–34.0)
MCHC: 33.6 g/dL (ref 30.0–36.0)
MCV: 88.9 fL (ref 80.0–100.0)
Monocytes Absolute: 0.6 K/uL (ref 0.1–1.0)
Monocytes Relative: 4 %
Neutro Abs: 15.2 K/uL — ABNORMAL HIGH (ref 1.7–7.7)
Neutrophils Relative %: 86 %
Platelets: 170 K/uL (ref 150–400)
RBC: 4.42 MIL/uL (ref 3.87–5.11)
RDW: 12.1 % (ref 11.5–15.5)
Smear Review: NORMAL
WBC: 17.6 K/uL — ABNORMAL HIGH (ref 4.0–10.5)
nRBC: 0 % (ref 0.0–0.2)

## 2024-04-14 LAB — CBG MONITORING, ED
Glucose-Capillary: 365 mg/dL — ABNORMAL HIGH (ref 70–99)
Glucose-Capillary: 291 mg/dL — ABNORMAL HIGH (ref 70–99)
Glucose-Capillary: 231 mg/dL — ABNORMAL HIGH (ref 70–99)

## 2024-04-14 LAB — BASIC METABOLIC PANEL WITH GFR
Anion gap: 20 — ABNORMAL HIGH (ref 5–15)
BUN: 38 mg/dL — ABNORMAL HIGH (ref 8–23)
CO2: 19 mmol/L — ABNORMAL LOW (ref 22–32)
Calcium: 8.6 mg/dL — ABNORMAL LOW (ref 8.9–10.3)
Chloride: 101 mmol/L (ref 98–111)
Creatinine, Ser: 2.14 mg/dL — ABNORMAL HIGH (ref 0.44–1.00)
GFR, Estimated: 22 mL/min — ABNORMAL LOW (ref >60)
Glucose, Bld: 350 mg/dL — ABNORMAL HIGH (ref 70–99)
Potassium: 4.7 mmol/L (ref 3.5–5.1)
Sodium: 139 mmol/L (ref 135–145)

## 2024-04-14 LAB — LACTIC ACID, PLASMA: Lactic Acid, Venous: 3.4 mmol/L (ref 0.5–1.9)

## 2024-04-14 LAB — D-DIMER, QUANTITATIVE: D-Dimer, Quant: 1.17 ug{FEU}/mL — ABNORMAL HIGH (ref 0.00–0.50)

## 2024-04-14 LAB — BETA-HYDROXYBUTYRIC ACID: Beta-Hydroxybutyric Acid: 1.1 mmol/L — ABNORMAL HIGH (ref 0.05–0.27)

## 2024-04-14 MED ORDER — DEXTROSE 50 % IV SOLN: 0.0000 mL | INTRAVENOUS | Status: DC | PRN

## 2024-04-14 MED ORDER — HYDROCODONE-ACETAMINOPHEN 5-325 MG PO TABS: 1.0000 | ORAL_TABLET | ORAL | Status: DC | PRN

## 2024-04-14 MED ORDER — ACETAMINOPHEN 650 MG RE SUPP: 650.0000 mg | Freq: Four times a day (QID) | RECTAL | Status: DC | PRN

## 2024-04-14 MED ORDER — DEXTROSE IN LACTATED RINGERS 5 % IV SOLN
INTRAVENOUS | Status: DC
  Filled 2024-04-14: qty 1000

## 2024-04-14 MED ORDER — MEMANTINE HCL 5 MG PO TABS
10.0000 mg | ORAL_TABLET | Freq: Two times a day (BID) | ORAL | Status: DC
  Administered 2024-04-14 – 2024-04-17 (×6): 10 mg via ORAL
  Filled 2024-04-14 (×6): qty 2

## 2024-04-14 MED ORDER — INSULIN REGULAR(HUMAN) IN NACL 100-0.9 UT/100ML-% IV SOLN
INTRAVENOUS | Status: DC
  Administered 2024-04-14: 21:00:00 5.5 [IU]/h via INTRAVENOUS
  Filled 2024-04-14: qty 100

## 2024-04-14 MED ORDER — ACETAMINOPHEN 325 MG PO TABS
650.0000 mg | ORAL_TABLET | Freq: Four times a day (QID) | ORAL | Status: DC | PRN
  Administered 2024-04-14: 23:00:00 650 mg via ORAL
  Filled 2024-04-14: qty 2

## 2024-04-14 MED ORDER — ENOXAPARIN SODIUM 30 MG/0.3ML IJ SOSY
30.0000 mg | PREFILLED_SYRINGE | INTRAMUSCULAR | Status: DC
  Administered 2024-04-14 – 2024-04-16 (×3): 30 mg via SUBCUTANEOUS
  Filled 2024-04-14 (×3): qty 0.3

## 2024-04-14 MED ORDER — SODIUM CHLORIDE 0.9% FLUSH
3.0000 mL | Freq: Two times a day (BID) | INTRAVENOUS | Status: DC
  Administered 2024-04-14 – 2024-04-17 (×6): 3 mL via INTRAVENOUS

## 2024-04-14 MED ORDER — ONDANSETRON HCL 4 MG/2ML IJ SOLN: 4.0000 mg | Freq: Four times a day (QID) | INTRAMUSCULAR | Status: DC | PRN

## 2024-04-14 MED ORDER — ATORVASTATIN CALCIUM 20 MG PO TABS
40.0000 mg | ORAL_TABLET | Freq: Every day | ORAL | Status: DC
  Administered 2024-04-14 – 2024-04-17 (×4): 40 mg via ORAL
  Filled 2024-04-14 (×4): qty 2

## 2024-04-14 MED ORDER — LACTATED RINGERS IV BOLUS
20.0000 mL/kg | Freq: Once | INTRAVENOUS | Status: AC
  Administered 2024-04-14: 21:00:00 1180 mL via INTRAVENOUS

## 2024-04-14 MED ORDER — POTASSIUM CHLORIDE 10 MEQ/100ML IV SOLN
10.0000 meq | INTRAVENOUS | Status: AC
  Administered 2024-04-14 (×2): 10 meq via INTRAVENOUS
  Filled 2024-04-14 (×2): qty 100

## 2024-04-14 MED ORDER — ICOSAPENT ETHYL 1 G PO CAPS
1.0000 g | ORAL_CAPSULE | Freq: Two times a day (BID) | ORAL | Status: DC
  Administered 2024-04-15 – 2024-04-17 (×6): 1 g via ORAL
  Filled 2024-04-14 (×6): qty 1

## 2024-04-14 MED ORDER — ONDANSETRON HCL 4 MG PO TABS: 4.0000 mg | ORAL_TABLET | Freq: Four times a day (QID) | ORAL | Status: DC | PRN

## 2024-04-14 MED ORDER — DONEPEZIL HCL 5 MG PO TABS
10.0000 mg | ORAL_TABLET | Freq: Every day | ORAL | Status: DC
  Administered 2024-04-14 – 2024-04-16 (×3): 10 mg via ORAL
  Filled 2024-04-14 (×3): qty 2

## 2024-04-14 MED ORDER — LACTATED RINGERS IV SOLN: INTRAVENOUS | Status: DC

## 2024-04-14 MED ORDER — SODIUM CHLORIDE 0.9 % IV BOLUS
1000.0000 mL | Freq: Once | INTRAVENOUS | Status: AC
  Administered 2024-04-14: 18:00:00 1000 mL via INTRAVENOUS

## 2024-04-14 MED ADMIN — Lactated Ringer's Solution: 1180 mL | INTRAVENOUS | @ 22:00:00 | NDC 00264775000

## 2024-04-14 NOTE — ED Notes (Signed)
 To x-ray

## 2024-04-14 NOTE — ED Notes (Signed)
Pt in hallway bed' 

## 2024-04-14 NOTE — Assessment & Plan Note (Signed)
 Secondary to DKA Expecting improvement with IV fluid resuscitation Monitor renal function and avoid nephrotoxins

## 2024-04-14 NOTE — Progress Notes (Signed)
 Anticoagulation monitoring(Lovenox ):  83 yo female ordered Lovenox  40 mg Q24h    Filed Weights   04/14/24 2016  Weight: 59 kg (130 lb)   BMI 20.4    Lab Results  Component Value Date   CREATININE 2.14 (H) 04/14/2024   CREATININE 1.98 (H) 04/14/2024   CREATININE 1.87 (H) 08/14/2021   Estimated Creatinine Clearance: 17.9 mL/min (A) (by C-G formula based on SCr of 2.14 mg/dL (H)). Hemoglobin & Hematocrit     Component Value Date/Time   HGB 14.7 04/14/2024 1353   HCT 43.3 04/14/2024 1353     Per Protocol for Patient with estCrcl < 30 ml/min and BMI < 30, will transition to Lovenox  30 mg Q24h.

## 2024-04-14 NOTE — ED Provider Notes (Signed)
 [facility] Provider Note    Event Date/Time   First MD Initiated Contact with Patient 04/14/24 1709     (approximate)  History   Chief Complaint: Dizziness  HPI  Tracey Keith is a 83 y.o. female with a past medical history of gastric reflux, hypertension, hyperlipidemia, memory loss/dementia, presents to the emergency department from her assisted living facility after a syncopal episode.  According to EMS report patient is coming from [facility] nursing facility when she was walking back to her room from the dining hall patient had a syncopal episode.  They noted that the patient had been complaining of nausea earlier today and was complaining of feeling dizzy.  They state that the patient was helped before she fell and did not fall or hit her head.  Here patient initially hypotensive 81/44 but has increased currently 128/72 patient did receive 500 mL of LR prior to arrival.  Physical Exam   Triage Vital Signs: ED Triage Vitals  Encounter Vitals Group     BP 04/14/24 1351 (!) 99/53     Girls Systolic BP Percentile --      Girls Diastolic BP Percentile --      Boys Systolic BP Percentile --      Boys Diastolic BP Percentile --      Pulse Rate 04/14/24 1351 62     Resp 04/14/24 1351 16     Temp 04/14/24 1351 98 F (36.7 C)     Temp Source 04/14/24 1351 Oral     SpO2 04/14/24 1351 91 %     Weight --      Height --      Head Circumference --      Peak Flow --      Pain Score 04/14/24 1331 0     Pain Loc --      Pain Education --      Exclude from Growth Chart --     Most recent vital signs: Vitals:   04/14/24 1351 04/14/24 1627  BP: (!) 99/53 128/72  Pulse: 62 76  Resp: 16 18  Temp: 98 F (36.7 C) 98 F (36.7 C)  SpO2: 91% 98%    General: Awake, no distress.  CV:  Good peripheral perfusion.  Regular rate and rhythm  Resp:  Normal effort.  Equal breath sounds bilaterally.  Abd:  No distention.  Soft, nontender.  No rebound or  guarding.  ED Results / Procedures / Treatments   EKG  EKG viewed and interpreted by myself shows a normal sinus rhythm at 65 bpm with a narrow QRS, left axis deviation, largely normal intervals with nonspecific ST changes.  No ST elevation.  RADIOLOGY  I have reviewed interpret the chest x-ray images.  No obvious consolidation seen to my evaluation.   MEDICATIONS ORDERED IN ED: Medications  sodium chloride  0.9 % bolus 1,000 mL (1,000 mLs Intravenous New Bag/Given 04/14/24 1742)     IMPRESSION / MDM / ASSESSMENT AND PLAN / ED COURSE  I reviewed the triage vital signs and the nursing notes.  Patient's presentation is most consistent with acute presentation with potential threat to life or bodily function.  Patient presents to the emergency department for a syncopal episode per EMS per nursing facility patient was also complaining of some dizziness and nausea earlier today.  Patient's workup in the emergency department shows a significant leukocytosis of 27,000 no prior leukocytosis noted on lab work in our system or Care Everywhere.  Patient's chemistry shows  signs of dehydration.  Patient's chemistry also shows chronic kidney disease and elevated glucose of 278.  Will add on a VBG to ensure that the patient is not in any mild DKA.  Given the leukocytosis of unknown origin we will also obtain a urine sample and a chest x-ray.  We will continue to closely monitor.  Patient's beta hydroxybutyric acid is elevated 1.1, pH is at 7.3 consistent with mild to moderate diabetic ketoacidosis which would explain the leukocytosis the anion gap the nausea and the weakness.  Will start the patient on insulin  infusion continue fluids and admit to the hospital service for further workup and treatment.  CRITICAL CARE Performed by: Franky Moores   Total critical care time: 30 minutes  Critical care time was exclusive of separately billable procedures and treating other patients.  Critical care  was necessary to treat or prevent imminent or life-threatening deterioration.  Critical care was time spent personally by me on the following activities: development of treatment plan with patient and/or surrogate as well as nursing, discussions with consultants, evaluation of patient's response to treatment, examination of patient, obtaining history from patient or surrogate, ordering and performing treatments and interventions, ordering and review of laboratory studies, ordering and review of radiographic studies, pulse oximetry and re-evaluation of patient's condition.   FINAL CLINICAL IMPRESSION(S) / ED DIAGNOSES   Weakness Syncope Leukocytosis Diabetic ketoacidosis  Note:  This document was prepared using Dragon voice recognition software and may include unintentional dictation errors.   Moores Franky, MD 04/14/24 2035

## 2024-04-14 NOTE — Assessment & Plan Note (Signed)
 Continue home Namenda  Aricept  Delirium precautions

## 2024-04-14 NOTE — Assessment & Plan Note (Signed)
 glucose 350, anion gap 20 and bicarb 19.  Beta hydroxybutyric acid 1.1 with venous pH of 7.28. Continue IV fluids, insulin  drip and electrolyte replacement with Endo tool Transition to subcu once gap is closed

## 2024-04-14 NOTE — Assessment & Plan Note (Signed)
 Suspect related to DKA

## 2024-04-14 NOTE — Assessment & Plan Note (Signed)
 Possibly related to DKA Getting CT head-had been having balance problems in the week prior-->nonacute Continuous cardiac monitoring and echocardiogram Neurologic checks with fall precautions

## 2024-04-14 NOTE — ED Triage Notes (Signed)
 Pt comes via EMS from [facility] assisted [facility] with c/o syncopal pt has episode when walking back from dinning room. Pt did have nausea this morning earlier and felt dizzy.   Pt had witnessed episode and pt did not fall.   BP sitting 81/44  Last bp 136/84  18 g in rac 500 fluids of LR and 4 mg zofran  pt does have Alzheimer but at baseline CBG 234

## 2024-04-14 NOTE — Assessment & Plan Note (Signed)
 Normotensive Was hypotensive on arrival Will hold antihypertensives tonight and resume as appropriate

## 2024-04-14 NOTE — ED Notes (Signed)
 Pt moved to room 12 out of hallway bed.   Md aware

## 2024-04-14 NOTE — H&P (Incomplete)
 History and Physical    Patient: Tracey Keith FMW:[id number] DOB: 16-Feb-1941 DOA: 04/14/2024 DOS: the patient was seen and examined on 04/14/2024 PCP: System, Provider Not In  Patient coming from: Home  Chief Complaint:  Chief Complaint  Patient presents with   Dizziness    HPI: Tracey Keith is a 83 y.o. female with medical history significant for CKD 3B, DM, HTN, dementia, being admitted with mild DKA presenting as a syncopal episode.  Patient was at her facility when she became off balance level to look backwards however she was caught and help down.  She had brief loss of consciousness.  Facility reports that over the past week she has had nausea and appeared unsteady. On arrival, BP 99/53 with otherwise normal vitals.  BP was fluid responsive to 124/61 by admission. Initial labs were consistent with mild DKA with glucose 278,anion gap of 17, which worsened on repeat several hours later, with glucose 350, anion gap 20 and bicarb 19.  Beta hydroxybutyric acid at this time was 1.1 with venous pH of 7.28.  Creatinine, near baseline at 1.98 on arrival had increased to 2.14 when repeated several hours later. Other labs concerning for WBC 27,000. UA and respiratory viral panels were not done Chest x-ray was nonacute Patient started on IV fluid bolus and  insulin  infusion Admission requested for syncope secondary to DKA     Past Medical History:  Diagnosis Date   DJD (degenerative joint disease) of pelvis    hips   GERD (gastroesophageal reflux disease)    w/cough   Hiatal hernia    Hip fracture (HCC) 1980's   left   Hyperlipidemia    Hypertension    Memory loss    Osteoarthritis    hands, finger   Osteopenia    RBBB 2013   Tremor    Past Surgical History:  Procedure Laterality Date   ABDOMINAL HYSTERECTOMY     many yrs ago   SMALL INTESTINE SURGERY     for obstruction   Social History:  reports that she has never smoked. She has never used smokeless tobacco. She  reports that she does not drink alcohol and does not use drugs.  Allergies[1]  Family History  Problem Relation Age of Onset   Tremor Maternal Aunt     Prior to Admission medications  Medication Sig Start Date End Date Taking? Authorizing Provider  acetaminophen  (TYLENOL ) 500 MG tablet Take 500 mg by mouth every 4 (four) hours as needed for mild pain (pain score 1-3).   Yes [provider]  amLODipine (NORVASC) 10 MG tablet Take 10 mg by mouth daily. 04/12/24  Yes [provider]  atorvastatin  (LIPITOR ) 40 MG tablet Take 40 mg by mouth daily. 03/25/24  Yes [provider]  cetirizine (ZYRTEC) 5 MG tablet Take 5 mg by mouth daily.   Yes [provider]  Cholecalciferol (VITAMIN D3) 50 MCG (2000 UT) TABS Take 1 tablet by mouth daily.   Yes [provider]  citalopram (CELEXA) 10 MG tablet Take 10 mg by mouth daily. 09/06/21  Yes [provider]  donepezil  (ARICEPT ) 10 MG tablet Take 1 tablet (10 mg total) by mouth at bedtime. 12/29/19  Yes Sater, Charlie LABOR, MD  empagliflozin (JARDIANCE) 10 MG TABS tablet Take 10 mg by mouth daily.   Yes [provider]  ferrous sulfate 325 (65 FE) MG tablet Take 325 mg by mouth every other day.   Yes [provider]  fluticasone (FLONASE) 50  MCG/ACT nasal spray Place 2 sprays into both nostrils daily.   Yes [provider]  glipiZIDE (GLUCOTROL) 5 MG tablet Take 5 mg by mouth daily. 09/06/21  Yes [provider]  icosapent  Ethyl (VASCEPA ) 1 g capsule Take 1 capsule by mouth 2 (two) times daily. 09/06/21  Yes [provider]  LANTUS  SOLOSTAR 100 UNIT/ML Solostar Pen Inject 47 Units into the skin at bedtime.   Yes [provider]  magnesium oxide (MAG-OX) 400 MG tablet Take 400 mg by mouth 2 (two) times daily.   Yes [provider]  memantine  (NAMENDA ) 10 MG tablet Take 1 tablet (10 mg total) by mouth 2 (two) times daily. 09/07/23  Yes Lomax, Amy, NP   zinc oxide 20 % ointment Apply 1 Application topically 3 (three) times daily as needed for irritation or diaper changes. 03/22/24  Yes [provider]    Physical Exam: Vitals:   04/14/24 1840 04/14/24 2016 04/14/24 2030 04/14/24 2052  BP: 136/66  124/61   Pulse: 84  88   Resp:   14   Temp:      TempSrc:      SpO2: 96%  99%   Weight:  59 kg    Height:    5' 7 (1.702 m)   Physical Exam  Labs on Admission: I have personally reviewed following labs and imaging studies  CBC: Recent Labs  Lab 04/14/24 1353  WBC 27.4*  HGB 14.7  HCT 43.3  MCV 88.0  PLT 194   Basic Metabolic Panel: Recent Labs  Lab 04/14/24 1353 04/14/24 1951  NA 139 139  K 4.6 4.7  CL 99 101  CO2 23 19*  GLUCOSE 278* 350*  BUN 29* 38*  CREATININE 1.98* 2.14*  CALCIUM  9.6 8.6*   GFR: Estimated Creatinine Clearance: 17.9 mL/min (A) (by C-G formula based on SCr of 2.14 mg/dL (H)). Liver Function Tests: Recent Labs  Lab 04/14/24 1353  AST 24  ALT 25  ALKPHOS 84  BILITOT 0.5  PROT 8.3*  ALBUMIN 4.7   No results for input(s): LIPASE, AMYLASE in the last 168 hours. No results for input(s): AMMONIA in the last 168 hours. Coagulation Profile: No results for input(s): INR, PROTIME in the last 168 hours. Cardiac Enzymes: No results for input(s): CKTOTAL, CKMB, CKMBINDEX, TROPONINI in the last 168 hours. BNP (last 3 results) No results for input(s): PROBNP in the last 8760 hours. HbA1C: No results for input(s): HGBA1C in the last 72 hours. CBG: Recent Labs  Lab 04/14/24 2050  GLUCAP 365*   Lipid Profile: No results for input(s): CHOL, HDL, LDLCALC, TRIG, CHOLHDL, LDLDIRECT in the last 72 hours. Thyroid  Function Tests: No results for input(s): TSH, T4TOTAL, FREET4, T3FREE, THYROIDAB in the last 72 hours. Anemia Panel: No results for input(s): VITAMINB12, FOLATE, FERRITIN, TIBC, IRON, RETICCTPCT in the last 72 hours. Urine  analysis:    Component Value Date/Time   COLORURINE STRAW (A) 08/14/2021 2245   APPEARANCEUR CLEAR (A) 08/14/2021 2245   LABSPEC 1.005 08/14/2021 2245   PHURINE 5.0 08/14/2021 2245   GLUCOSEU 150 (A) 08/14/2021 2245   HGBUR NEGATIVE 08/14/2021 2245   BILIRUBINUR NEGATIVE 08/14/2021 2245   KETONESUR NEGATIVE 08/14/2021 2245   PROTEINUR NEGATIVE 08/14/2021 2245   UROBILINOGEN 0.2 08/21/2009 0317   NITRITE NEGATIVE 08/14/2021 2245   LEUKOCYTESUR NEGATIVE 08/14/2021 2245    Radiological Exams on Admission: DG Chest 2 View Result Date: 04/14/2024 EXAM: 2 VIEW(S) XRAY OF THE CHEST 04/14/2024 05:49:52 PM COMPARISON: None available.  CLINICAL HISTORY: weakness FINDINGS: LUNGS AND PLEURA: No focal pulmonary opacity. No pleural effusion. No pneumothorax. HEART AND MEDIASTINUM: Calcified aortic atherosclerosis. Moderate hiatal hernia. BONES AND SOFT TISSUES: No acute osseous abnormality. IMPRESSION: 1. No acute cardiopulmonary abnormality. 2. Moderate hiatal hernia. 3. Calcified aortic atherosclerosis. Electronically signed by: Dorethia Molt MD 04/14/2024 06:16 PM EST RP Workstation: [id number]   Data Reviewed for HPI: Relevant notes from primary care and specialist visits, past discharge summaries as available in EHR, including Care Everywhere. Prior diagnostic testing as pertinent to current admission diagnoses Updated medications and problem lists for reconciliation ED course, including vitals, labs, imaging, treatment and response to treatment Triage notes, nursing and pharmacy notes and ED provider's notes Notable results as noted above in HPI      Assessment and Plan: * Diabetic ketoacidosis without coma associated with type 2 diabetes mellitus (HCC) glucose 350, anion gap 20 and bicarb 19.  Beta hydroxybutyric acid 1.1 with venous pH of 7.28. Continue IV fluids, insulin  drip and electrolyte replacement with Endo tool Transition to subcu once gap is closed  Syncope Possibly related  to DKA Getting CT head-had been having balance problems in the week prior Continuous cardiac monitoring and echocardiogram Neurologic checks with fall precautions  Leukocytosis Possibly reactive from syncopal episode and DKA Chest x-ray clear Follow UA to evaluate for UTI Patient otherwise without stigmata of infection Monitor for improvement with IV fluids  Acute renal failure superimposed on stage 3b chronic kidney disease (HCC) Secondary to DKA Expecting improvement with IV fluid resuscitation Monitor renal function and avoid nephrotoxins  Late onset Alzheimer's disease without behavioral disturbance (HCC) Continue home Namenda  Aricept  Delirium precautions  HTN (hypertension) Normotensive Was hypotensive on arrival Will hold antihypertensives tonight and resume as appropriate     DVT prophylaxis: Lovenox   Consults: none  Advance Care Planning: full code  Family Communication: none  Disposition Plan: Back to previous home environment  Severity of Illness: The appropriate patient status for this patient is INPATIENT. Inpatient status is judged to be reasonable and necessary in order to provide the required intensity of service to ensure the patient's safety. The patient's presenting symptoms, physical exam findings, and initial radiographic and laboratory data in the context of their chronic comorbidities is felt to place them at high risk for further clinical deterioration. Furthermore, it is not anticipated that the patient will be medically stable for discharge from the hospital within 2 midnights of admission.   * I certify that at the point of admission it is my clinical judgment that the patient will require inpatient hospital care spanning beyond 2 midnights from the point of admission due to high intensity of service, high risk for further deterioration and high frequency of surveillance required.*  Author: Delayne LULLA Solian, MD 04/14/2024 9:51 PM  For on call  review [email or web address].     [1]  Allergies Allergen Reactions   Cephalexin Itching   Codeine Nausea Only

## 2024-04-14 NOTE — Assessment & Plan Note (Signed)
 Possibly reactive from syncopal episode and DKA Chest x-ray clear Follow UA to evaluate for UTI Patient otherwise without stigmata of infection Monitor for improvement with IV fluids

## 2024-04-14 NOTE — ED Notes (Signed)
 Attending MD Dr. Cleatus made aware of pts critical Lactic Acid of 3.4.

## 2024-04-14 NOTE — Hospital Course (Signed)
 SABRA

## 2024-04-15 DIAGNOSIS — E1110 Type 2 diabetes mellitus with ketoacidosis without coma: Secondary | ICD-10-CM | POA: Diagnosis not present

## 2024-04-15 DIAGNOSIS — R42 Dizziness and giddiness: Secondary | ICD-10-CM | POA: Diagnosis not present

## 2024-04-15 LAB — BASIC METABOLIC PANEL WITH GFR
Anion gap: 10 (ref 5–15)
Anion gap: 16 — ABNORMAL HIGH (ref 5–15)
Anion gap: 17 — ABNORMAL HIGH (ref 5–15)
BUN: 29 mg/dL — ABNORMAL HIGH (ref 8–23)
BUN: 33 mg/dL — ABNORMAL HIGH (ref 8–23)
BUN: 35 mg/dL — ABNORMAL HIGH (ref 8–23)
CO2: 17 mmol/L — ABNORMAL LOW (ref 22–32)
CO2: 19 mmol/L — ABNORMAL LOW (ref 22–32)
CO2: 22 mmol/L (ref 22–32)
Calcium: 8.1 mg/dL — ABNORMAL LOW (ref 8.9–10.3)
Calcium: 8.3 mg/dL — ABNORMAL LOW (ref 8.9–10.3)
Calcium: 8.6 mg/dL — ABNORMAL LOW (ref 8.9–10.3)
Chloride: 104 mmol/L (ref 98–111)
Chloride: 106 mmol/L (ref 98–111)
Chloride: 108 mmol/L (ref 98–111)
Creatinine, Ser: 1.74 mg/dL — ABNORMAL HIGH (ref 0.44–1.00)
Creatinine, Ser: 1.91 mg/dL — ABNORMAL HIGH (ref 0.44–1.00)
Creatinine, Ser: 1.93 mg/dL — ABNORMAL HIGH (ref 0.44–1.00)
GFR, Estimated: 25 mL/min — ABNORMAL LOW
GFR, Estimated: 25 mL/min — ABNORMAL LOW
GFR, Estimated: 28 mL/min — ABNORMAL LOW
Glucose, Bld: 141 mg/dL — ABNORMAL HIGH (ref 70–99)
Glucose, Bld: 208 mg/dL — ABNORMAL HIGH (ref 70–99)
Glucose, Bld: 230 mg/dL — ABNORMAL HIGH (ref 70–99)
Potassium: 3.8 mmol/L (ref 3.5–5.1)
Potassium: 4 mmol/L (ref 3.5–5.1)
Potassium: 4.6 mmol/L (ref 3.5–5.1)
Sodium: 139 mmol/L (ref 135–145)
Sodium: 140 mmol/L (ref 135–145)
Sodium: 140 mmol/L (ref 135–145)

## 2024-04-15 LAB — CBC
HCT: 35.5 % — ABNORMAL LOW (ref 36.0–46.0)
Hemoglobin: 12 g/dL (ref 12.0–15.0)
MCH: 29.9 pg (ref 26.0–34.0)
MCHC: 33.8 g/dL (ref 30.0–36.0)
MCV: 88.3 fL (ref 80.0–100.0)
Platelets: 170 K/uL (ref 150–400)
RBC: 4.02 MIL/uL (ref 3.87–5.11)
RDW: 12.2 % (ref 11.5–15.5)
WBC: 13.7 K/uL — ABNORMAL HIGH (ref 4.0–10.5)
nRBC: 0 % (ref 0.0–0.2)

## 2024-04-15 LAB — GLUCOSE, CAPILLARY
Glucose-Capillary: 163 mg/dL — ABNORMAL HIGH (ref 70–99)
Glucose-Capillary: 137 mg/dL — ABNORMAL HIGH (ref 70–99)

## 2024-04-15 LAB — URINALYSIS, COMPLETE (UACMP) WITH MICROSCOPIC
Bilirubin Urine: NEGATIVE
Glucose, UA: >=500 mg/dL — AB
Hgb urine dipstick: NEGATIVE
Ketones, ur: NEGATIVE mg/dL
Leukocytes,Ua: NEGATIVE
Nitrite: NEGATIVE
Protein, ur: NEGATIVE mg/dL
Specific Gravity, Urine: 1.016 (ref 1.005–1.030)
pH: 5 (ref 5.0–8.0)

## 2024-04-15 LAB — RESP PANEL BY RT-PCR (RSV, FLU A&B, COVID)  RVPGX2
Influenza A by PCR: NEGATIVE
Influenza B by PCR: NEGATIVE
Resp Syncytial Virus by PCR: NEGATIVE
SARS Coronavirus 2 by RT PCR: NEGATIVE

## 2024-04-15 LAB — CBG MONITORING, ED
Glucose-Capillary: 148 mg/dL — ABNORMAL HIGH (ref 70–99)
Glucose-Capillary: 148 mg/dL — ABNORMAL HIGH (ref 70–99)
Glucose-Capillary: 148 mg/dL — ABNORMAL HIGH (ref 70–99)
Glucose-Capillary: 158 mg/dL — ABNORMAL HIGH (ref 70–99)
Glucose-Capillary: 162 mg/dL — ABNORMAL HIGH (ref 70–99)
Glucose-Capillary: 177 mg/dL — ABNORMAL HIGH (ref 70–99)
Glucose-Capillary: 223 mg/dL — ABNORMAL HIGH (ref 70–99)
Glucose-Capillary: 224 mg/dL — ABNORMAL HIGH (ref 70–99)

## 2024-04-15 LAB — BLOOD GAS, VENOUS
Bicarbonate: 29.5 mmol/L — AB (ref 20.0–28.0)
O2 Saturation: 30.5 % (ref 0.0–2.0)
Patient temperature: 37
Patient temperature: 37 %
pCO2, Ven: 60 mmHg (ref 44–60)
pH, Ven: 7.3 (ref 7.25–7.43)
pO2, Ven: <31 mmHg — CL (ref 32–45)

## 2024-04-15 LAB — HEMOGLOBIN A1C
Hgb A1c MFr Bld: 8.3 % — ABNORMAL HIGH (ref 4.8–5.6)
Mean Plasma Glucose: 191.51 mg/dL

## 2024-04-15 LAB — LACTIC ACID, PLASMA
Lactic Acid, Venous: 2.5 mmol/L (ref 0.5–1.9)
Lactic Acid, Venous: 4 mmol/L (ref 0.5–1.9)
Lactic Acid, Venous: 7 mmol/L (ref 0.5–1.9)

## 2024-04-15 MED ORDER — LACTATED RINGERS IV BOLUS
500.0000 mL | Freq: Once | INTRAVENOUS | Status: AC
  Administered 2024-04-15: 500 mL via INTRAVENOUS

## 2024-04-15 MED ORDER — INSULIN GLARGINE 100 UNIT/ML ~~LOC~~ SOLN
12.0000 [IU] | Freq: Every day | SUBCUTANEOUS | Status: DC
  Administered 2024-04-15 – 2024-04-17 (×3): 12 [IU] via SUBCUTANEOUS
  Filled 2024-04-15 (×3): qty 0.12

## 2024-04-15 MED ORDER — INSULIN ASPART 100 UNIT/ML IJ SOLN
0.0000 [IU] | Freq: Three times a day (TID) | INTRAMUSCULAR | Status: DC
  Administered 2024-04-15 – 2024-04-16 (×3): 1 [IU] via SUBCUTANEOUS
  Filled 2024-04-15 (×3): qty 1

## 2024-04-15 MED ORDER — LACTATED RINGERS IV SOLN: INTRAVENOUS | Status: AC

## 2024-04-15 NOTE — ED Notes (Signed)
 Informed RN bed assigned

## 2024-04-15 NOTE — ED Notes (Signed)
 Pt found to be sitting up in the chair in her room, hospital gown, heart monitor, & B/P cuff off.  Bed/stretcher alarm going off, pt had removed her mittens and gotten herself dressed back into her own clothes and had pulled both of her IV's out.  New 20g IV placed in pts RFA, old IV sites bandaged with gauze and tape with pressure held till bleeding had stopped.  Resumed pts IV fluids and insulin .  Pt reported to this RN that she was just getting up to use the bathroom.  Pt assisted back into bed, ED Charge RN Garnette made aware of pts behavior this morning and her increased fall risk behaviors even with the use of the stretcher alarm, side rails and mittens.  Requested for a safety sitter to sit with pt to help keep her in bed.  Safety sitter w/ ED Nurse Tech now in use.

## 2024-04-15 NOTE — ED Notes (Signed)
 Not able to transport pt at this time because on insulin  drip

## 2024-04-15 NOTE — ED Notes (Signed)
 This tech got pt on bedpan and cleaned her up

## 2024-04-15 NOTE — ED Notes (Signed)
 Pt was able to ambulate to the toilet in her room independently, had pt use the hat in the commode to provide a urine sample.  Instructed pt to not place any toilet paper in the hat, pt complied, unfortunately pt had a bowel movement at the same time and contaminated the urine sample.  Pt was able to walk back to bed without assistance.

## 2024-04-15 NOTE — ED Notes (Signed)
 This RN and RN Rosina performed peri care for pt. Pt had areas of reddness noted on bottom and barrier cream was applied. New chuck pad and brief was applied. Pt's bed in lowest, locked position with call bell in reach.

## 2024-04-15 NOTE — Progress Notes (Signed)
" °  PROGRESS NOTE    Tracey Keith  FMW:[id number] DOB: Aug 07, 1940 DOA: 04/14/2024 PCP: System, Provider Not In  224A/224A-AA  LOS: 1 day   Brief hospital course:   Assessment & Plan: Tracey Keith is a 83 y.o. female with medical history significant for CKD 3B, DM, HTN, dementia, being admitted with mild DKA presenting as a syncopal episode.  Patient was at her facility when she became off balance level to look backwards however she was caught and help down.  She had brief loss of consciousness.  Facility reports that over the past week she has had nausea and appeared unsteady.    * Diabetic ketoacidosis without coma associated with type 2 diabetes mellitus (HCC) glucose 350, anion gap 20 and bicarb 19.  Beta hydroxybutyric acid 1.1 with venous pH of 7.28. --off insulin  gtt and transitioned to subQ insulin  around noon.  --cont Lantus 12u daily --ACHS and SSI --cont MIVF  Syncope Possibly related to DKA --head CT no acute finding  Leukocytosis Lactic acidosis Possibly reactive from syncopal episode and DKA Lactic acidosis possibly related to DKA --cont MIVF --trend lactic acid --hold abx  Acute renal failure superimposed on stage 3b chronic kidney disease (HCC) Secondary to dehydration from DKA --cont MIVF  Late onset Alzheimer's disease without behavioral disturbance (HCC) Continue home Namenda  Aricept  Delirium precautions  HTN (hypertension) Normotensive Was hypotensive on arrival Will hold antihypertensives tonight and resume as appropriate  Diarrhea --had mild fever 100.5 last night --obtain blood cx --obtain C diff test   DVT prophylaxis: Lovenox  SQ Code Status: Full code  Family Communication:  Level of care: Med-Surg Dispo:   The patient is from: assisted living  Anticipated d/c is to: to be determined Anticipated d/c date is: 1-2 days   Subjective and Interval History:  Pt was off insulin  gtt and transitioned to subQ insulin  around noon.  RN  reported at least a couple of episodes of diarrhea since last night.   Objective: Vitals:   04/15/24 1330 04/15/24 1415 04/15/24 1452 04/15/24 1955  BP: (!) 107/58 (!) 108/56 (!) 123/49 (!) 121/56  Pulse: 68 73 72 70  Resp: 14 16 16 16   Temp:  98.9 F (37.2 C) 98.9 F (37.2 C) 99.3 F (37.4 C)  TempSrc:  Oral Oral   SpO2: 93% 94% 97% 93%  Weight:      Height:        Intake/Output Summary (Last 24 hours) at 04/15/2024 2023 Last data filed at 04/15/2024 1437 Gross per 24 hour  Intake 2347.02 ml  Output --  Net 2347.02 ml   Filed Weights   04/14/24 2016  Weight: 59 kg    Examination:   Constitutional: NAD, alert, conversational but oriented to self only HEENT: conjunctivae and lids normal, EOMI CV: No cyanosis.   RESP: normal respiratory effort, on RA Neuro: II - XII grossly intact.     Data Reviewed: I have personally reviewed labs and imaging studies  Time spent: 50 minutes  Ellouise Haber, MD [facility] If 7PM-7AM, please contact night-coverage 04/15/2024, 8:23 PM   "

## 2024-04-15 NOTE — Progress Notes (Signed)
 Spoke with Niece [name] and informed pt in room 227 now

## 2024-04-15 NOTE — Inpatient Diabetes Management (Signed)
 Inpatient Diabetes Program Recommendations  AACE/ADA: New Consensus Statement on Inpatient Glycemic Control  Target Ranges:  Prepandial:   less than 140 mg/dL      Peak postprandial:   less than 180 mg/dL (1-2 hours)      Critically ill patients:  140 - 180 mg/dL    Latest Reference Range & Units 04/15/24 00:03 04/15/24 00:59 04/15/24 01:58 04/15/24 03:02 04/15/24 04:05 04/15/24 05:34 04/15/24 07:23  Glucose-Capillary 70 - 99 mg/dL 775 (H) 776 (H) 822 (H) 158 (H) 162 (H) 148 (H) 148 (H)    Latest Reference Range & Units 04/14/24 13:53 04/14/24 19:51 04/14/24 23:51 04/15/24 05:35  CO2 22 - 32 mmol/L 23 19 (L) 19 (L) 17 (L)  Glucose 70 - 99 mg/dL 721 (H) 649 (H) 769 (H) 141 (H)  BUN 8 - 23 mg/dL 29 (H) 38 (H) 35 (H) 33 (H)  Creatinine 0.44 - 1.00 mg/dL 8.01 (H) 7.85 (H) 8.08 (H) 1.93 (H)  Calcium  8.9 - 10.3 mg/dL 9.6 8.6 (L) 8.1 (L) 8.6 (L)  Anion gap 5 - 15  17 (H) 20 (H) 16 (H) 17 (H)    Latest Reference Range & Units 04/14/24 18:19  Beta-Hydroxybutyric Acid 0.05 - 0.27 mmol/L 1.10 (H)  (H): Data is abnormally high  Latest Reference Range & Units 04/14/24 21:09 04/15/24 00:09 04/15/24 04:15  Lactic Acid, Venous 0.5 - 1.9 mmol/L 3.4 (HH) 4.0 (HH) 7.0 (HH)   Review of Glycemic Control  Diabetes history: DM2 Outpatient Diabetes medications: Lantus  47 units at bedtime, Glipizide 5 mg BID, Jardiance 10 mg daily Current orders for Inpatient glycemic control: IV insulin   Inpatient Diabetes Program Recommendations:    Insulin : Once provider is ready to transition from IV to SQ insulin , please consider ordering Lantus  12 units Q24H (based on 59 kg x 0.2 units), CBGs Q4H, and Novolog  0-9 units Q4H.  Thanks, Earnie Gainer, RN, MSN, CDCES Diabetes Coordinator Inpatient Diabetes Program [phone] (Team Pager from 8am to 5pm)

## 2024-04-16 DIAGNOSIS — E1110 Type 2 diabetes mellitus with ketoacidosis without coma: Secondary | ICD-10-CM | POA: Diagnosis not present

## 2024-04-16 DIAGNOSIS — R42 Dizziness and giddiness: Secondary | ICD-10-CM | POA: Diagnosis not present

## 2024-04-16 LAB — GLUCOSE, CAPILLARY
Glucose-Capillary: 119 mg/dL — ABNORMAL HIGH (ref 70–99)
Glucose-Capillary: 142 mg/dL — ABNORMAL HIGH (ref 70–99)
Glucose-Capillary: 147 mg/dL — ABNORMAL HIGH (ref 70–99)
Glucose-Capillary: 117 mg/dL — ABNORMAL HIGH (ref 70–99)

## 2024-04-16 LAB — C DIFFICILE QUICK SCREEN W PCR REFLEX
C Diff antigen: NEGATIVE
C Diff interpretation: NOT DETECTED
C Diff toxin: NEGATIVE

## 2024-04-16 LAB — LACTIC ACID, PLASMA: Lactic Acid, Venous: 0.6 mmol/L (ref 0.5–1.9)

## 2024-04-16 MED ORDER — HALOPERIDOL LACTATE 5 MG/ML IJ SOLN: 1.0000 mg | Freq: Four times a day (QID) | INTRAMUSCULAR | Status: DC | PRN

## 2024-04-16 MED ORDER — LOPERAMIDE HCL 2 MG PO CAPS: 2.0000 mg | ORAL_CAPSULE | Freq: Every day | ORAL | Status: DC | PRN

## 2024-04-16 NOTE — Progress Notes (Signed)
" °  PROGRESS NOTE    Tracey Keith  FMW:[id number] DOB: August 19, 1940 DOA: 04/14/2024 PCP: System, Provider Not In  224A/224A-AA  LOS: 2 days   Brief hospital course:   Assessment & Plan: Tracey Keith is a 83 y.o. female with medical history significant for CKD 3B, DM, HTN, dementia, being admitted with mild DKA presenting as a syncopal episode.  Patient was at her facility when she became off balance level to look backwards however she was caught and help down.  She had brief loss of consciousness.  Facility reports that over the past week she has had nausea and appeared unsteady.    * Diabetic ketoacidosis without coma associated with type 2 diabetes mellitus (HCC) glucose 350, anion gap 20 and bicarb 19.  Beta hydroxybutyric acid 1.1 with venous pH of 7.28. --off insulin  gtt and transitioned to subQ insulin  around noon on 12/19 --cont Lantus  12u daily --ACHS and SSI  Syncope Possibly related to DKA --head CT no acute finding  Leukocytosis Lactic acidosis Possibly reactive from syncopal episode and DKA Lactic acidosis possibly related to DKA, resolved with IVF.  Acute renal failure superimposed on stage 3b chronic kidney disease (HCC) Secondary to dehydration from DKA --improved with IVF. --oral hydration now  Late onset Alzheimer's disease without behavioral disturbance (HCC) Continue home Namenda  Aricept  Delirium precautions  HTN (hypertension) Normotensive Was hypotensive on arrival Will hold antihypertensives tonight and resume as appropriate  Diarrhea --had mild fever 100.5  --obtained blood cx.  C diff neg. --Imodium  PRN   DVT prophylaxis: Lovenox  SQ Code Status: Full code  Family Communication:  Level of care: Med-Surg Dispo:   The patient is from: assisted living  Anticipated d/c is to: ALF Anticipated d/c date is: tomorrow   Subjective and Interval History:  Pt ate her meals.     Objective: Vitals:   04/15/24 1415 04/15/24 1452 04/15/24 1955  04/16/24 0939  BP: (!) 108/56 (!) 123/49 (!) 121/56 (!) 113/54  Pulse: 73 72 70 64  Resp: 16 16 16 12   Temp: 98.9 F (37.2 C) 98.9 F (37.2 C) 99.3 F (37.4 C) 98.5 F (36.9 C)  TempSrc: Oral Oral    SpO2: 94% 97% 93% 97%  Weight:      Height:        Intake/Output Summary (Last 24 hours) at 04/16/2024 1846 Last data filed at 04/16/2024 1500 Gross per 24 hour  Intake 612.74 ml  Output --  Net 612.74 ml   Filed Weights   04/14/24 2016  Weight: 59 kg    Examination:   Constitutional: NAD CV: No cyanosis.   RESP: normal respiratory effort, on RA   Data Reviewed: I have personally reviewed labs and imaging studies  Time spent: 35 minutes  Ellouise Haber, MD [facility] If 7PM-7AM, please contact night-coverage 04/16/2024, 6:46 PM   "

## 2024-04-16 NOTE — TOC Progression Note (Signed)
 Transition of Care [facility]) - Progression Note    Patient Details  Name: Tracey Keith MRN: [id number] Date of Birth: 1940/06/03  Transition of Care [facility] CM/SW Contact  Victory Jackquline GORMAN, RN Phone Number: 04/16/2024, 5:04 PM  Clinical Narrative: RNCM received a message via secure chat from the MD asking me to see if the patient could return to her assisted living tomorrow with needing supervision for all mobility when she discharges.  RNCM spoke to Tammy with [facility] @ [phone] and she said that the patient could return to the facility with HH/PT/OT/RN orders. She will be going to RM#3 and nurse can call report to: [phone]. RNCM will continue to follow for discharge planning needs.                        Expected Discharge Plan and Services                                               Social Drivers of Health (SDOH) Interventions SDOH Screenings   Food Insecurity: No Food Insecurity (04/15/2024)  Housing: Low Risk (04/15/2024)  Transportation Needs: No Transportation Needs (04/15/2024)  Utilities: Not At Risk (04/15/2024)  Social Connections: Moderately Integrated (04/15/2024)  Tobacco Use: Low Risk (04/14/2024)    Readmission Risk Interventions     No data to display

## 2024-04-16 NOTE — Evaluation (Signed)
 Physical Therapy Evaluation Patient Details Name: Tracey Keith MRN: [id number] DOB: 23-Jun-1940 Today's Date: 04/16/2024  History of Present Illness  Tracey Keith is a 83 y.o. female with medical history significant for CKD 3B, DM, HTN, dementia, being admitted with mild DKA presenting as a syncopal episode.  Patient was at her facility when she became off balance level to look backwards however she was caught and help down.  She had brief loss of consciousness.   Clinical Impression  Patient received reclining in bed with sitter in room. Patient pleasantly cooperative and agreeable to PT evaluation but only oriented to self and unable to provide reliable history. Based on chart review, she has been living at an assisted living facility prior to hospitalization. Upon PT evaluation, patient needed supervision for bed mobility and supervision to CGA for transfers. She was able to perform sit <> stand from low commode but demonstrated poor safety awareness and body/AD positioning when going sit <> stand with RW. She ambulated ~240 feet with CGA without and AD and was mostly steady with occasional staggering to the right and left that was concerning for safety. Therefore she ambulated ~240 feet again with a RW (she could not remember if she had ever used one) with SBA to min A. She was more steady overall but continued to drift to the right, requiring min A to correct the direction of the RW to avoid hitting some people in the hallway on the right. She also was noted to need cuing for every direction change and was unable to find her room without directions. She also had trouble locating the paper towels and trash in the bathroom, but was counting christmas trees in the hallway that she could see from across the room. At this point patient is in need of constant supervision for mobility tasks with occasional physical assist up to min A for mobility, mostly due to cognitive limitations and inconsistent  balance. Patient would benefit from skilled physical therapy to address impairments and functional limitations (see PT Problem List below) to work towards stated goals and return to PLOF or maximal functional independence.        If plan is discharge home, recommend the following: A little help with walking and/or transfers;Assistance with cooking/housework;Assist for transportation;Supervision due to cognitive status;A little help with bathing/dressing/bathroom;Help with stairs or ramp for entrance   Can travel by private vehicle    yes    Equipment Recommendations Rolling walker (2 wheels)  Recommendations for Other Services       Functional Status Assessment Patient has had a recent decline in their functional status and/or demonstrates limited ability to make significant improvements in function in a reasonable and predictable amount of time     Precautions / Restrictions Precautions Precautions: Fall Recall of Precautions/Restrictions: Impaired      Mobility  Bed Mobility Overal bed mobility: Needs Assistance Bed Mobility: Supine to Sit     Supine to sit: Supervision     General bed mobility comments: needed cuing to remember what she was doing    Transfers Overall transfer level: Needs assistance Equipment used: None, Rolling walker (2 wheels) Transfers: Sit to/from Stand Sit to Stand: Supervision, Contact guard assist           General transfer comment: sit <> stand from bed to commode and commode to bed to chair. Initial transfers with CGA then completed sit to stand from toilet to bed with occasional cuing and CGA-SBA for safety. She then transferred from  bed to chair uisng RW and demonstrated unsafe handling of the walker, pushing it aside to sit in the chair. She appeared mostly steady but not reliably steady.    Ambulation/Gait Ambulation/Gait assistance: Supervision, Contact guard assist, Min assist Gait Distance (Feet): 240 Feet Assistive device:  None, Rolling walker (2 wheels) Gait Pattern/deviations: Staggering left, Staggering right, Drifts right/left Gait velocity: WFL     General Gait Details: Patient ambulated ~240 feet around nursing station with no AD and CGA. She was mostly steady, but needed cuing for all direction changes and to find her room again and she would intermittantly jerk and stagger to the right or the left. She completed a second ambulation of ~240 feet around the nursing station with a RW and supervision, again needing cuing for directions but demonstrating more steady gait. She also drifted to the right and needed min A to move the RW left to keep from hitting some other people in the hallway on her right. She failed to keep her body inside the RW in her room and moved it aside when going to sit.  Stairs            Wheelchair Mobility     Tilt Bed    Modified Rankin (Stroke Patients Only)       Balance Overall balance assessment: Needs assistance Sitting-balance support: No upper extremity supported, Feet supported Sitting balance-Leahy Scale: Good     Standing balance support: No upper extremity supported, Bilateral upper extremity supported, During functional activity Standing balance-Leahy Scale: Good Standing balance comment: Patient has sudden staggering or unsteadiness when upright without AD and does not handle AD appropriately to prevent falls without cuing or guarding.                             Pertinent Vitals/Pain Pain Assessment Pain Assessment: Faces Pain Score: 0-No pain    Home Living Family/patient expects to be discharged to:: Assisted living                        Prior Function Prior Level of Function : Patient poor historian/Family not available                     Extremity/Trunk Assessment   Upper Extremity Assessment Upper Extremity Assessment: Overall WFL for tasks assessed    Lower Extremity Assessment Lower Extremity  Assessment: Overall WFL for tasks assessed    Cervical / Trunk Assessment Cervical / Trunk Assessment: Normal  Communication   Communication Communication: No apparent difficulties    Cognition Arousal: Alert Behavior During Therapy: Impulsive   PT - Cognitive impairments: No family/caregiver present to determine baseline, Memory, Awareness, Orientation, Safety/Judgement   Orientation impairments: Person                   PT - Cognition Comments: Patient able to provide name and date of birth. She is not oriented to place, time, or situation. She needs step by step cuing for some tasks, and is unable to find her way back to her room, find the paper towels or the trash in the bathroom without cuing. Following commands: Intact       Cueing Cueing Techniques: Verbal cues, Gestural cues     General Comments      Exercises Other Exercises Other Exercises: pt educated on safe handling of AD, role of PT in acute care setting, safe mobility  Assessment/Plan    PT Assessment Patient needs continued PT services  PT Problem List Decreased strength;Decreased mobility;Decreased safety awareness;Decreased knowledge of precautions;Decreased cognition;Decreased balance;Decreased knowledge of use of DME       PT Treatment Interventions DME instruction;Therapeutic exercise;Gait training;Balance training;Neuromuscular re-education;Functional mobility training;Cognitive remediation;Therapeutic activities;Patient/family education    PT Goals (Current goals can be found in the Care Plan section)  Acute Rehab PT Goals PT Goal Formulation: Patient unable to participate in goal setting    Frequency Min 1X/week     Co-evaluation               AM-PAC PT 6 Clicks Mobility  Outcome Measure Help needed turning from your back to your side while in a flat bed without using bedrails?: A Little Help needed moving from lying on your back to sitting on the side of a flat bed without  using bedrails?: A Little Help needed moving to and from a bed to a chair (including a wheelchair)?: A Little Help needed standing up from a chair using your arms (e.g., wheelchair or bedside chair)?: A Little Help needed to walk in hospital room?: A Little Help needed climbing 3-5 steps with a railing? : A Little 6 Click Score: 18    End of Session Equipment Utilized During Treatment: Gait belt Activity Tolerance: Patient tolerated treatment well Patient left: in chair;with call bell/phone within reach;with nursing/sitter in room;with chair alarm set Nurse Communication: Mobility status PT Visit Diagnosis: Other abnormalities of gait and mobility (R26.89);Other symptoms and signs involving the nervous system (R29.898);Muscle weakness (generalized) (M62.81)    Time: 8490-8469 PT Time Calculation (min) (ACUTE ONLY): 21 min   Charges:   PT Evaluation $PT Eval Moderate Complexity: 1 Mod PT Treatments $Therapeutic Activity: 8-22 mins PT General Charges $$ ACUTE PT VISIT: 1 Visit        Camie R. Juli, PT, DPT, Cert. MDT, PRA-C 04/16/2024, 4:03 PM

## 2024-04-17 ENCOUNTER — Inpatient Hospital Stay
Admit: 2024-04-17 | Discharge: 2024-04-17 | Disposition: A | Discharge status: Home / Self Care | Attending: Internal Medicine | Admitting: Internal Medicine

## 2024-04-17 DIAGNOSIS — R55 Syncope and collapse: Secondary | ICD-10-CM | POA: Diagnosis not present

## 2024-04-17 DIAGNOSIS — R42 Dizziness and giddiness: Secondary | ICD-10-CM | POA: Diagnosis not present

## 2024-04-17 DIAGNOSIS — E1110 Type 2 diabetes mellitus with ketoacidosis without coma: Secondary | ICD-10-CM | POA: Diagnosis not present

## 2024-04-17 LAB — ECHOCARDIOGRAM COMPLETE
AR max vel: 1.75 cm2
AV Area VTI: 1.82 cm2
AV Area mean vel: 1.84 cm2
AV Mean grad: 8.5 mmHg
AV Peak grad: 17.1 mmHg
Ao pk vel: 2.07 m/s
Area-P 1/2: 3.34 cm2
Height: 67 in
S' Lateral: 2.2 cm
Weight: 1980.8 [oz_av]

## 2024-04-17 LAB — GLUCOSE, CAPILLARY: Glucose-Capillary: 115 mg/dL — ABNORMAL HIGH (ref 70–99)

## 2024-04-17 MED ORDER — LANTUS SOLOSTAR 100 UNIT/ML ~~LOC~~ SOPN: 12.0000 [IU] | PEN_INJECTOR | Freq: Every day | SUBCUTANEOUS | Status: AC

## 2024-04-17 NOTE — Plan of Care (Signed)
" °  Problem: Education: Goal: Ability to describe self-care measures that may prevent or decrease complications (Diabetes Survival Skills Education) will improve Outcome: Progressing Goal: Individualized Educational Video(s) Outcome: Progressing   Problem: Coping: Goal: Ability to adjust to condition or change in health will improve Outcome: Progressing   Problem: Fluid Volume: Goal: Ability to maintain a balanced intake and output will improve Outcome: Progressing   Problem: Health Behavior/Discharge Planning: Goal: Ability to identify and utilize available resources and services will improve Outcome: Progressing Goal: Ability to manage health-related needs will improve Outcome: Progressing   Problem: Metabolic: Goal: Ability to maintain appropriate glucose levels will improve Outcome: Progressing   Problem: Nutritional: Goal: Maintenance of adequate nutrition will improve Outcome: Progressing Goal: Progress toward achieving an optimal weight will improve Outcome: Progressing   Problem: Skin Integrity: Goal: Risk for impaired skin integrity will decrease Outcome: Progressing   Problem: Tissue Perfusion: Goal: Adequacy of tissue perfusion will improve Outcome: Progressing   Problem: Education: Goal: Ability to describe self-care measures that may prevent or decrease complications (Diabetes Survival Skills Education) will improve Outcome: Progressing Goal: Individualized Educational Video(s) Outcome: Progressing   Problem: Cardiac: Goal: Ability to maintain an adequate cardiac output will improve Outcome: Progressing   Problem: Health Behavior/Discharge Planning: Goal: Ability to identify and utilize available resources and services will improve Outcome: Progressing Goal: Ability to manage health-related needs will improve Outcome: Progressing   Problem: Fluid Volume: Goal: Ability to achieve a balanced intake and output will improve Outcome: Progressing    Problem: Metabolic: Goal: Ability to maintain appropriate glucose levels will improve Outcome: Progressing   Problem: Nutritional: Goal: Maintenance of adequate nutrition will improve Outcome: Progressing Goal: Maintenance of adequate weight for body size and type will improve Outcome: Progressing   Problem: Respiratory: Goal: Will regain and/or maintain adequate ventilation Outcome: Progressing   Problem: Urinary Elimination: Goal: Ability to achieve and maintain adequate renal perfusion and functioning will improve Outcome: Progressing   Problem: Education: Goal: Knowledge of condition and prescribed therapy will improve Outcome: Progressing   Problem: Cardiac: Goal: Will achieve and/or maintain adequate cardiac output Outcome: Progressing   Problem: Physical Regulation: Goal: Complications related to the disease process, condition or treatment will be avoided or minimized Outcome: Progressing   Problem: Education: Goal: Knowledge of General Education information will improve Description: Including pain rating scale, medication(s)/side effects and non-pharmacologic comfort measures Outcome: Progressing   Problem: Health Behavior/Discharge Planning: Goal: Ability to manage health-related needs will improve Outcome: Progressing   Problem: Clinical Measurements: Goal: Ability to maintain clinical measurements within normal limits will improve Outcome: Progressing Goal: Will remain free from infection Outcome: Progressing Goal: Diagnostic test results will improve Outcome: Progressing Goal: Respiratory complications will improve Outcome: Progressing Goal: Cardiovascular complication will be avoided Outcome: Progressing   Problem: Activity: Goal: Risk for activity intolerance will decrease Outcome: Progressing   Problem: Nutrition: Goal: Adequate nutrition will be maintained Outcome: Progressing   Problem: Coping: Goal: Level of anxiety will  decrease Outcome: Progressing   Problem: Elimination: Goal: Will not experience complications related to bowel motility Outcome: Progressing Goal: Will not experience complications related to urinary retention Outcome: Progressing   Problem: Pain Managment: Goal: General experience of comfort will improve and/or be controlled Outcome: Progressing   Problem: Safety: Goal: Ability to remain free from injury will improve Outcome: Progressing   Problem: Skin Integrity: Goal: Risk for impaired skin integrity will decrease Outcome: Progressing   "

## 2024-04-17 NOTE — Discharge Summary (Addendum)
 "  Physician Discharge Summary   Tracey Keith  female DOB: 1941-04-28  FMW:[id number]  PCP: System, Provider Not In  Admit date: 04/14/2024 Discharge date: 04/17/2024  Admitted From: ALF Disposition:  ALF Home Health: Yes CODE STATUS: Full code  Discharge Instructions     Diet Carb Modified   Complete by: As directed    No wound care   Complete by: As directed       Hospital Course:  For full details, please see H&P, progress notes, consult notes and ancillary notes.  Briefly,  Tracey Keith is a 83 y.o. female with medical history significant for CKD 3B, DM, HTN, dementia, being admitted with DKA presenting as a syncopal episode.    Patient was at her ALF when she became off balance level to look backwards however she was caught and help down.  She had brief loss of consciousness.  Facility reports that over the past week she has had nausea and appeared unsteady.    * Diabetic ketoacidosis without coma associated with type 2 diabetes mellitus (HCC) glucose 350, anion gap 20 and bicarb 19.  Beta hydroxybutyric acid 1.1 with venous pH of 7.28.  Unclear reason for DKA, since pt should be getting her insulin  at her facility and A1c only mildly elevated at 8.3. --Pt was off insulin  gtt and transitioned to subQ insulin  around noon on 12/19, and has been only needing Lantus  12 daily.   --At discharge, home Lantus  is reduced from 47u to 12u nightly.  Home Jardiance d/c'ed in case that was the contributing factor to DKA.  Home glipizide d/c'ed due to age.   Syncope likely related to DKA --head CT no acute finding   Leukocytosis Possibly reactive.  Improved without abx.  Lactic acidosis Lactic acidosis likely related to DKA, resolved with IVF.   Acute renal failure superimposed on stage 3b chronic kidney disease (HCC) Secondary to dehydration from DKA.  Cr peaked at 2.14, improved with IVF, Cr 1.74 prior to discharge.   Late onset Alzheimer's disease without behavioral  disturbance (HCC) --able to converse, however, oriented to self only. Continue home Namenda  Aricept    HTN (hypertension) Was hypotensive on arrival, so home amlodipine held, to be resumed after discharge.   Diarrhea --C diff neg.   Discharge Diagnoses:  Principal Problem:   Diabetic ketoacidosis without coma associated with type 2 diabetes mellitus (HCC) Active Problems:   Syncope   Leukocytosis   Late onset Alzheimer's disease without behavioral disturbance (HCC)   Acute renal failure superimposed on stage 3b chronic kidney disease (HCC)   HTN (hypertension)   Lactic acidosis   30 Day Unplanned Readmission Risk Score    Flowsheet Row ED to Hosp-Admission (Current) from 04/14/2024 in [facility]  30 Day Unplanned Readmission Risk Score (%) 18.42 Filed at 04/17/2024 0801    This score is the patient's risk of an unplanned readmission within 30 days of being discharged (0 -100%). The score is based on dignosis, age, lab data, medications, orders, and past utilization.   Low:  0-14.9   Medium: 15-21.9   High: 22-29.9   Extreme: 30 and above         Discharge Instructions:  Allergies as of 04/17/2024       Reactions   Cephalexin Itching   Codeine Nausea Only        Medication List     STOP taking these medications    empagliflozin 10 MG Tabs tablet Commonly known as:  JARDIANCE   glipiZIDE 5 MG tablet Commonly known as: GLUCOTROL       TAKE these medications    acetaminophen  500 MG tablet Commonly known as: TYLENOL  Take 500 mg by mouth every 4 (four) hours as needed for mild pain (pain score 1-3).   amLODipine 10 MG tablet Commonly known as: NORVASC Take 10 mg by mouth daily.   atorvastatin  40 MG tablet Commonly known as: LIPITOR  Take 40 mg by mouth daily.   cetirizine 5 MG tablet Commonly known as: ZYRTEC Take 5 mg by mouth daily.   citalopram 10 MG tablet Commonly known as: CELEXA Take 10 mg by mouth  daily.   donepezil  10 MG tablet Commonly known as: ARICEPT  Take 1 tablet (10 mg total) by mouth at bedtime.   ferrous sulfate 325 (65 FE) MG tablet Take 325 mg by mouth every other day.   fluticasone 50 MCG/ACT nasal spray Commonly known as: FLONASE Place 2 sprays into both nostrils daily.   icosapent  Ethyl 1 g capsule Commonly known as: VASCEPA  Take 1 capsule by mouth 2 (two) times daily.   Lantus  SoloStar 100 UNIT/ML Solostar Pen Generic drug: insulin  glargine Inject 12 Units into the skin at bedtime. Reduced from 47 unit. What changed:  how much to take additional instructions   magnesium oxide 400 MG tablet Commonly known as: MAG-OX Take 400 mg by mouth 2 (two) times daily.   memantine  10 MG tablet Commonly known as: NAMENDA  Take 1 tablet (10 mg total) by mouth 2 (two) times daily.   Vitamin D3 50 MCG (2000 UT) Tabs Take 1 tablet by mouth daily.   zinc oxide 20 % ointment Apply 1 Application topically 3 (three) times daily as needed for irritation or diaper changes.          Allergies[1]   The results of significant diagnostics from this hospitalization (including imaging, microbiology, ancillary and laboratory) are listed below for reference.   Consultations:   Procedures/Studies: CT HEAD WO CONTRAST ( ) Result Date: 04/14/2024 EXAM: CT HEAD WITHOUT CONTRAST 04/14/2024 09:54:16 PM TECHNIQUE: CT of the head was performed without the administration of intravenous contrast. Automated exposure control, iterative reconstruction, and/or weight based adjustment of the mA/kV was utilized to reduce the radiation dose to as low as reasonably achievable. COMPARISON: None available. CLINICAL HISTORY: Neuro deficit, acute, stroke suspected. FINDINGS: BRAIN AND VENTRICLES: No acute hemorrhage. No evidence of acute infarct. Age-related cerebral volume loss. Mild periventricular white matter disease. 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 No  hydrocephalus. No extra-axial collection. No mass effect or midline shift. ORBITS: Bilateral lens replacement. SINUSES: Partial opacification of bilateral mastoid air cells. SOFT TISSUES AND SKULL: No acute soft tissue abnormality. No skull fracture. VASCULATURE: Moderate calcific atheromatous disease within carotid siphons and vertebral arteries. IMPRESSION: 1. No acute intracranial abnormality. 2. Age-related cerebral volume loss, mild periventricular white matter disease, small chronic lacunar infarct within the right centrum semiovale, moderate calcific atheromatous disease within the carotid siphons and vertebral arteries, and bilateral lens replacements. Electronically signed by: Dorethia Molt MD 04/14/2024 10:07 PM EST RP Workstation: [id number]   DG Chest 2 View Result Date: 04/14/2024 EXAM: 2 VIEW(S) XRAY OF THE CHEST 04/14/2024 05:49:52 PM COMPARISON: None available. CLINICAL HISTORY: weakness FINDINGS: LUNGS AND PLEURA: No focal pulmonary opacity. No pleural effusion. No pneumothorax. HEART AND MEDIASTINUM: Calcified aortic atherosclerosis. Moderate hiatal hernia. BONES AND SOFT TISSUES: No acute osseous abnormality. IMPRESSION: 1. No acute cardiopulmonary abnormality. 2. Moderate hiatal hernia. 3. Calcified aortic atherosclerosis. Electronically  signed by: Dorethia Molt MD 04/14/2024 06:16 PM EST RP Workstation: [id number]      Labs: BNP (last 3 results) No results for input(s): BNP in the last 8760 hours. Basic Metabolic Panel: Recent Labs  Lab 04/14/24 1353 04/14/24 1951 04/14/24 2351 04/15/24 0535 04/15/24 0908  NA 139 139 139 140 140  K 4.6 4.7 4.6 3.8 4.0  CL 99 101 104 106 108  CO2 23 19* 19* 17* 22  GLUCOSE 278* 350* 230* 141* 208*  BUN 29* 38* 35* 33* 29*  CREATININE 1.98* 2.14* 1.91* 1.93* 1.74*  CALCIUM  9.6 8.6* 8.1* 8.6* 8.3*   Liver Function Tests: Recent Labs  Lab 04/14/24 1353  AST 24  ALT 25  ALKPHOS 84  BILITOT 0.5  PROT 8.3*  ALBUMIN 4.7   No  results for input(s): LIPASE, AMYLASE in the last 168 hours. No results for input(s): AMMONIA in the last 168 hours. CBC: Recent Labs  Lab 04/14/24 1353 04/14/24 1951 04/15/24 0535  WBC 27.4* 17.6* 13.7*  NEUTROABS  --  15.2*  --   HGB 14.7 13.2 12.0  HCT 43.3 39.3 35.5*  MCV 88.0 88.9 88.3  PLT 194 170 170   Cardiac Enzymes: No results for input(s): CKTOTAL, CKMB, CKMBINDEX, TROPONINI in the last 168 hours. BNP: Invalid input(s): POCBNP CBG: Recent Labs  Lab 04/16/24 0744 04/16/24 1143 04/16/24 1614 04/16/24 2036 04/17/24 0803  GLUCAP 119* 142* 147* 117* 115*   D-Dimer Recent Labs    04/14/24 2109  DDIMER 1.17*   Hgb A1c Recent Labs    04/15/24 0535  HGBA1C 8.3*   Lipid Profile No results for input(s): CHOL, HDL, LDLCALC, TRIG, CHOLHDL, LDLDIRECT in the last 72 hours. Thyroid  function studies No results for input(s): TSH, T4TOTAL, T3FREE, THYROIDAB in the last 72 hours.  Invalid input(s): FREET3 Anemia work up No results for input(s): VITAMINB12, FOLATE, FERRITIN, TIBC, IRON, RETICCTPCT in the last 72 hours. Urinalysis    Component Value Date/Time   COLORURINE STRAW (A) 04/15/2024 0908   APPEARANCEUR CLEAR (A) 04/15/2024 0908   LABSPEC 1.016 04/15/2024 0908   PHURINE 5.0 04/15/2024 0908   GLUCOSEU >=500 (A) 04/15/2024 0908   HGBUR NEGATIVE 04/15/2024 0908   BILIRUBINUR NEGATIVE 04/15/2024 0908   KETONESUR NEGATIVE 04/15/2024 0908   PROTEINUR NEGATIVE 04/15/2024 0908   UROBILINOGEN 0.2 08/21/2009 0317   NITRITE NEGATIVE 04/15/2024 0908   LEUKOCYTESUR NEGATIVE 04/15/2024 0908   Sepsis Labs Recent Labs  Lab 04/14/24 1353 04/14/24 1951 04/15/24 0535  WBC 27.4* 17.6* 13.7*   Microbiology Recent Results (from the past 240 hours)  Resp panel by RT-PCR (RSV, Flu A&B, Covid) Anterior Nasal Swab     Status: None   Collection Time: 04/14/24 10:56 PM   Specimen: Anterior Nasal Swab  Result Value Ref  Range Status   SARS Coronavirus 2 by RT PCR NEGATIVE NEGATIVE Final    Comment: (NOTE) SARS-CoV-2 target nucleic acids are NOT DETECTED.  The SARS-CoV-2 RNA is generally detectable in upper respiratory specimens during the acute phase of infection. The lowest concentration of SARS-CoV-2 viral copies this assay can detect is 138 copies/mL. A negative result does not preclude SARS-Cov-2 infection and should not be used as the sole basis for treatment or other patient management decisions. A negative result may occur with  improper specimen collection/handling, submission of specimen other than nasopharyngeal swab, presence of viral mutation(s) within the areas targeted by this assay, and inadequate number of viral copies(<138 copies/mL). A negative result must be combined with clinical  observations, patient history, and epidemiological information. The expected result is Negative.  Fact Sheet for Patients:  bloggercourse.com  Fact Sheet for Healthcare Providers:  seriousbroker.it  This test is no t yet approved or cleared by the [place]  FDA and  has been authorized for detection and/or diagnosis of SARS-CoV-2 by FDA under an Emergency Use Authorization (EUA). This EUA will remain  in effect (meaning this test can be used) for the duration of the COVID-19 declaration under Section 564(b)(1) of the Act, 21 U.S.C.section 360bbb-3(b)(1), unless the authorization is terminated  or revoked sooner.       Influenza A by PCR NEGATIVE NEGATIVE Final   Influenza B by PCR NEGATIVE NEGATIVE Final    Comment: (NOTE) The Xpert Xpress SARS-CoV-2/FLU/RSV plus assay is intended as an aid in the diagnosis of influenza from Nasopharyngeal swab specimens and should not be used as a sole basis for treatment. Nasal washings and aspirates are unacceptable for Xpert Xpress SARS-CoV-2/FLU/RSV testing.  Fact Sheet for  Patients: bloggercourse.com  Fact Sheet for Healthcare Providers: seriousbroker.it  This test is not yet approved or cleared by the [place]  FDA and has been authorized for detection and/or diagnosis of SARS-CoV-2 by FDA under an Emergency Use Authorization (EUA). This EUA will remain in effect (meaning this test can be used) for the duration of the COVID-19 declaration under Section 564(b)(1) of the Act, 21 U.S.C. section 360bbb-3(b)(1), unless the authorization is terminated or revoked.     Resp Syncytial Virus by PCR NEGATIVE NEGATIVE Final    Comment: (NOTE) Fact Sheet for Patients: bloggercourse.com  Fact Sheet for Healthcare Providers: seriousbroker.it  This test is not yet approved or cleared by the [place]  FDA and has been authorized for detection and/or diagnosis of SARS-CoV-2 by FDA under an Emergency Use Authorization (EUA). This EUA will remain in effect (meaning this test can be used) for the duration of the COVID-19 declaration under Section 564(b)(1) of the Act, 21 U.S.C. section 360bbb-3(b)(1), unless the authorization is terminated or revoked.  Performed at [facility], [address] [place], [place] [address]   Culture, blood (Routine X 2) w Reflex to ID Panel     Status: None (Preliminary result)   Collection Time: 04/15/24  5:41 PM   Specimen: BLOOD  Result Value Ref Range Status   Specimen Description BLOOD BLOOD RIGHT ARM  Final   Special Requests   Final    AEROBIC BOTTLE ONLY Blood Culture results may not be optimal due to an inadequate volume of blood received in culture bottles   Culture   Final    NO GROWTH 2 DAYS Performed at [facility], [address], [place], [place] [address]    Report Status PENDING  Incomplete  Culture, blood (Routine X 2) w Reflex to ID Panel     Status: None (Preliminary result)    Collection Time: 04/15/24  8:23 PM   Specimen: BLOOD  Result Value Ref Range Status   Specimen Description BLOOD BLOOD LEFT ARM  Final   Special Requests   Final    BOTTLES DRAWN AEROBIC AND ANAEROBIC Blood Culture results may not be optimal due to an excessive volume of blood received in culture bottles   Culture   Final    NO GROWTH 1 DAY Performed at [facility], [address], [place], [place] [address]    Report Status PENDING  Incomplete  C Difficile Quick Screen w PCR reflex     Status: None  Collection Time: 04/16/24 12:55 AM   Specimen: STOOL  Result Value Ref Range Status   C Diff antigen NEGATIVE NEGATIVE Final   C Diff toxin NEGATIVE NEGATIVE Final   C Diff interpretation No C. difficile detected.  Final    Comment: Performed at [facility], [address] [place], [place] [address]     Total time spend on discharging this patient, including the last patient exam, discussing the hospital stay, instructions for ongoing care as it relates to all pertinent caregivers, as well as preparing the medical discharge records, prescriptions, and/or referrals as applicable, is 35 minutes.    Ellouise Haber, MD  [facility] 04/17/2024, 9:51 AM       [1]  Allergies Allergen Reactions   Cephalexin Itching   Codeine Nausea Only   "

## 2024-04-17 NOTE — Progress Notes (Signed)
 Report given to Tammy at [facility]. Patient to be transported by her niece at bedside.

## 2024-04-17 NOTE — Progress Notes (Signed)
" °  Echocardiogram 2D Echocardiogram has been performed.  Thedora GORMAN Louder 04/17/2024, 10:12 AM "

## 2024-04-17 NOTE — TOC Transition Note (Addendum)
 Transition of Care [facility]) - Discharge Note   Patient Details  Name: Tracey Keith MRN: [id number] Date of Birth: Dec 21, 1940  Transition of Care [facility] CM/SW Contact:  Victory Jackquline GORMAN, RN Phone Number: 04/17/2024, 10:27 AM   Clinical Narrative:   Patient discharging back to [facility], RM# 3, nurse to call report to Tammy @ [phone]. Transportation set up through [facility] and there are 3 people ahead of her. Bedside nurse and MD made aware. RNCM called and left a messaged for the patient's niece [name] @ [phone]. Discharge summary and orders emailed to Tammy @ [email or web address].  No further concerns. RNCM signing off.  10:34: RNCM received a message via secure chat from the MD stating that she spoke to the patient's niece and that she will be transporting the patient to the [facility]. RNCM called [facility] to cancel transport.    Final next level of care: Assisted Living Barriers to Discharge: Barriers Resolved   Patient Goals and CMS Choice            Discharge Placement                Patient to be transferred to facility by: [facility] Name of family member notified: [name] Patient and family notified of of transfer: 04/17/24  Discharge Plan and Services Additional resources added to the After Visit Summary for                                       Social Drivers of Health (SDOH) Interventions SDOH Screenings   Food Insecurity: No Food Insecurity (04/15/2024)  Housing: Low Risk (04/15/2024)  Transportation Needs: No Transportation Needs (04/15/2024)  Utilities: Not At Risk (04/15/2024)  Social Connections: Moderately Integrated (04/15/2024)  Tobacco Use: Low Risk (04/14/2024)     Readmission Risk Interventions     No data to display

## 2024-04-20 LAB — CULTURE, BLOOD (ROUTINE X 2): Culture: NO GROWTH

## 2024-04-21 LAB — CULTURE, BLOOD (ROUTINE X 2): Culture: NO GROWTH

## 2024-09-08 ENCOUNTER — Ambulatory Visit: Admitting: Family Medicine
